# Patient Record
Sex: Male | Born: 2000
Health system: Southern US, Community
[De-identification: ages and names within clinical notes are randomized; demographics above are authoritative.]

## PROBLEM LIST (undated history)

## (undated) DIAGNOSIS — J45909 Unspecified asthma, uncomplicated: Secondary | ICD-10-CM

## (undated) DIAGNOSIS — F32A Depression, unspecified: Secondary | ICD-10-CM

## (undated) HISTORY — PX: NO PAST SURGERIES: SHX2092

---
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
## 2020-02-03 ENCOUNTER — Other Ambulatory Visit: Payer: Self-pay

## 2020-02-03 ENCOUNTER — Ambulatory Visit (INDEPENDENT_AMBULATORY_CARE_PROVIDER_SITE_OTHER): Payer: Medicaid Other

## 2020-02-03 ENCOUNTER — Ambulatory Visit (HOSPITAL_COMMUNITY)
Admission: EM | Admit: 2020-02-03 | Discharge: 2020-02-03 | Disposition: A | Discharge status: Home / Self Care | Payer: Medicaid Other | Attending: Family Medicine | Admitting: Family Medicine

## 2020-02-03 ENCOUNTER — Encounter (HOSPITAL_COMMUNITY): Payer: Self-pay

## 2020-02-03 DIAGNOSIS — M25572 Pain in left ankle and joints of left foot: Secondary | ICD-10-CM

## 2020-02-03 DIAGNOSIS — S93412A Sprain of calcaneofibular ligament of left ankle, initial encounter: Secondary | ICD-10-CM | POA: Diagnosis not present

## 2020-02-03 NOTE — Discharge Instructions (Signed)
Wear splint as discussed. Complete RICE therapy.

## 2020-02-03 NOTE — ED Triage Notes (Signed)
Pt c/o left ankle pain after falling/colliding into a wooden pallet yesterday while at work. Pt describes ankle weakening laterally. C/o pain to medial and lateral aspect; mild edema noted with limited ROM. Last dose tylenol last night.

## 2020-02-03 NOTE — ED Provider Notes (Signed)
[place]    CSN: [id number] Arrival date & time: 02/03/20  1217      History   Chief Complaint Chief Complaint  Patient presents with  . Ankle Pain    HPI Jeremy Copeland is a 19 y.o. male.   Patient here c/w L ankle pain x last night / this morning.  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  Admits pain, tenderness anterior and lateral aspect of ankle, worse with inversion/eversion of ankle.  Denies n/t, weakness, ecchymosis, swelling.  He has not tried medication or ice for this.     Past Medical History:  Diagnosis Date  . Asthma     There are no problems to display for this patient.   No past surgical history on file.     Home Medications    Prior to Admission medications   Not on File    Family History Family History  Problem Relation Age of Onset  . Healthy Mother   . Healthy Father     Social History Social History   Tobacco Use  . Smoking status: Never Smoker  . Smokeless tobacco: Never Used  Substance Use Topics  . Alcohol use: Never  . Drug use: Never     Allergies   Patient has no known allergies.   Review of Systems Review of Systems  Musculoskeletal: Positive for arthralgias and gait problem. Negative for joint swelling and myalgias.  Skin: Negative for color change.  Neurological: Negative for weakness and numbness.  Hematological: Negative for adenopathy. Does not bruise/bleed easily.  Psychiatric/Behavioral: Negative for confusion and sleep disturbance.     Physical Exam Triage Vital Signs ED Triage Vitals  Enc Vitals Group     BP 02/03/20 1323 (!) 157/128     Pulse Rate 02/03/20 1323 75     Resp 02/03/20 1323 18     Temp 02/03/20 1323 98.4 F (36.9 C)     Temp Source 02/03/20 1323 Oral     SpO2 02/03/20 1323 98 %     Weight --      Height --       Head Circumference --      Peak Flow --      Pain Score 02/03/20 1322 8     Pain Loc --      Pain Edu? --      Excl. in [place]? --    No data found.  Updated Vital Signs BP 126/78 (BP Location: Left Arm) Comment: re-eval  Pulse 75   Temp 98.4 F (36.9 C) (Oral)   Resp 18   SpO2 98%   Visual Acuity Right Eye Distance:   Left Eye Distance:   Bilateral Distance:    Right Eye Near:   Left Eye Near:    Bilateral Near:     Physical Exam Vitals and nursing note reviewed.  Constitutional:      Appearance: Normal appearance. He is well-developed.  HENT:     Head: Normocephalic and atraumatic.     Nose: Nose normal.     Mouth/Throat:     Mouth: Mucous membranes are moist.  Eyes:     General: No scleral icterus.    Conjunctiva/sclera: Conjunctivae normal.  Pulmonary:     Effort: Pulmonary effort is normal. No respiratory distress.  Musculoskeletal:  General: Normal range of motion.     Cervical back: Normal range of motion and neck supple. No rigidity.     Right ankle: Normal.     Left ankle: No swelling, deformity, ecchymosis or lacerations. Tenderness present over the lateral malleolus, ATF ligament and CF ligament. No posterior TF ligament tenderness. Normal range of motion. Anterior drawer test negative.     Left Achilles Tendon: No tenderness. Thompson's test negative.  Skin:    General: Skin is warm and dry.     Capillary Refill: Capillary refill takes less than 2 seconds.  Neurological:     General: No focal deficit present.     Mental Status: He is alert and oriented to person, place, and time.     Gait: Gait normal.  Psychiatric:        Mood and Affect: Mood normal.        Behavior: Behavior normal.      UC Treatments / Results  Labs (all labs ordered are listed, but only abnormal results are displayed) Labs Reviewed - No data to display  EKG   Radiology DG Ankle Complete Left  Result Date: 02/03/2020 CLINICAL DATA:  Acute left ankle pain  after twisting injury yesterday. EXAM: LEFT ANKLE COMPLETE - 3+ VIEW COMPARISON:  None. FINDINGS: There is no evidence of fracture, dislocation, or joint effusion. There is no evidence of arthropathy or other focal bone abnormality. Soft tissues are unremarkable. IMPRESSION: Negative. Electronically Signed   By: Obie Dredge M.D.   On: 02/03/2020 14:33    Procedures Procedures (including critical care time)  Medications Ordered in UC Medications - No data to display  Initial Impression / Assessment and Plan / UC Course  I have reviewed the triage vital signs and the nursing notes.  Pertinent labs & imaging results that were available during my care of the patient were reviewed by me and considered in my medical decision making (see chart for details).     Wear splint Start RICE therapy No running x 1 week, try to rest ankle as much as possible. Final Clinical Impressions(s) / UC Diagnoses   Final diagnoses:  Sprain of calcaneofibular ligament of left ankle, initial encounter     Discharge Instructions     Wear splint as discussed. Complete RICE therapy.    ED Prescriptions    None     PDMP not reviewed this encounter.   Evern Core, PA-C 02/03/20 1443

## 2020-04-28 ENCOUNTER — Other Ambulatory Visit: Payer: Self-pay

## 2020-04-28 ENCOUNTER — Ambulatory Visit (HOSPITAL_COMMUNITY)
Admission: EM | Admit: 2020-04-28 | Discharge: 2020-04-28 | Disposition: A | Discharge status: Home / Self Care | Payer: Medicaid Other | Attending: Family Medicine | Admitting: Family Medicine

## 2020-04-28 ENCOUNTER — Ambulatory Visit (INDEPENDENT_AMBULATORY_CARE_PROVIDER_SITE_OTHER): Payer: Medicaid Other

## 2020-04-28 ENCOUNTER — Encounter (HOSPITAL_COMMUNITY): Payer: Self-pay | Admitting: Gynecology

## 2020-04-28 DIAGNOSIS — R079 Chest pain, unspecified: Secondary | ICD-10-CM

## 2020-04-28 DIAGNOSIS — J209 Acute bronchitis, unspecified: Secondary | ICD-10-CM

## 2020-04-28 DIAGNOSIS — R0602 Shortness of breath: Secondary | ICD-10-CM | POA: Diagnosis not present

## 2020-04-28 MED ORDER — PREDNISONE 10 MG (21) PO TBPK: ORAL_TABLET | Freq: Every day | ORAL | 0 refills | Status: AC

## 2020-04-28 MED ORDER — ALBUTEROL SULFATE HFA 108 (90 BASE) MCG/ACT IN AERS: 2.0000 | INHALATION_SPRAY | RESPIRATORY_TRACT | 0 refills | Status: DC | PRN

## 2020-04-28 NOTE — Discharge Instructions (Addendum)
You have bronchitis  Your chest xray was negative  I have sent in a prednisone taper for you to take for 6 days. 6 tablets on day one, 5 tablets on day two, 4 tablets on day three, 3 tablets on day four, 2 tablets on day five, and 1 tablet on day six.  May use albuterol inhaler 2 puffs every 4 hours as needed for shortness of breath, wheezing  Follow up with this office or with primary care as needed  Go to the ER for trouble swallowing, trouble breathing, other concerning symptoms

## 2020-04-28 NOTE — ED Provider Notes (Signed)
[facility]   [id number] 04/28/20 Arrival Time: 1323   CC: COVID symptoms  SUBJECTIVE: History from: patient.  Jeremy Copeland is a 19 y.o. male who presents with shortness of breath and chest tightness and burning. Reports that he received his first dose of Covid vaccine about a week ago. Denies sick exposure to COVID, flu or strep. Denies recent travel. Has positive history of Covid. Has not completed Covid vaccines. Has not taken OTC medications for this. There are no aggravating or alleviating factors. Denies previous symptoms in the past. Denies fever, chills, sinus pain, rhinorrhea, sore throat, wheezing, nausea, changes in bowel or bladder habits. Declines Covid testing today.   ROS: As per HPI.  All other pertinent ROS negative.     Past Medical History:  Diagnosis Date  . Asthma    History reviewed. No pertinent surgical history. No Known Allergies No current facility-administered medications on file prior to encounter.   No current outpatient medications on file prior to encounter.   Social History   Socioeconomic History  . Marital status: Single    Spouse name: Not on file  . Number of children: Not on file  . Years of education: Not on file  . Highest education level: Not on file  Occupational History  . Not on file  Tobacco Use  . Smoking status: Never Smoker  . Smokeless tobacco: Never Used  Substance and Sexual Activity  . Alcohol use: Never  . Drug use: Never  . Sexual activity: Yes  Other Topics Concern  . Not on file  Social History Narrative  . Not on file   Social Determinants of Health   Financial Resource Strain:   . Difficulty of Paying Living Expenses: Not on file  Food Insecurity:   . Worried About Programme researcher, broadcasting/film/video in the Last Year: Not on file  . Ran Out of Food in the Last Year: Not on file  Transportation Needs:   . Lack of Transportation (Medical): Not on file  . Lack of Transportation (Non-Medical): Not on file    Physical Activity:   . Days of Exercise per Week: Not on file  . Minutes of Exercise per Session: Not on file  Stress:   . Feeling of Stress : Not on file  Social Connections:   . Frequency of Communication with Friends and Family: Not on file  . Frequency of Social Gatherings with Friends and Family: Not on file  . Attends Religious Services: Not on file  . Active Member of Clubs or Organizations: Not on file  . Attends Banker Meetings: Not on file  . Marital Status: Not on file  Intimate Partner Violence:   . Fear of Current or Ex-Partner: Not on file  . Emotionally Abused: Not on file  . Physically Abused: Not on file  . Sexually Abused: Not on file   Family History  Problem Relation Age of Onset  . Healthy Mother   . Healthy Father     OBJECTIVE:  Vitals:   04/28/20 1544 04/28/20 1547  BP: 127/67   Pulse: 80   Resp: 16   Temp: 98.1 F (36.7 C)   TempSrc: Oral   SpO2: 98%   Weight:  220 lb (99.8 kg)  Height:  5\' 11"  (1.803 m)     General appearance: alert; appears fatigued, but nontoxic; speaking in full sentences and tolerating own secretions HEENT: NCAT; Ears: EACs clear, TMs pearly gray; Eyes: PERRL.  EOM grossly intact. Sinuses: nontender;  Nose: nares patent without rhinorrhea, Throat: oropharynx clear, tonsils non erythematous or enlarged, uvula midline  Neck: supple without LAD Lungs: unlabored respirations, symmetrical air entry; cough: absent; no respiratory distress; diminished lung sounds Heart: regular rate and rhythm.  Radial pulses 2+ symmetrical bilaterally Skin: warm and dry Psychological: alert and cooperative; normal mood and affect  LABS:  No results found for this or any previous visit (from the past 24 hour(s)).   ASSESSMENT & PLAN:  1. Acute bronchitis, unspecified organism   2. SOB (shortness of breath)   3. Chest pain, unspecified type     Meds ordered this encounter  Medications  . albuterol (VENTOLIN HFA) 108 (90  Base) MCG/ACT inhaler    Sig: Inhale 2 puffs into the lungs every 4 (four) hours as needed for wheezing or shortness of breath.    Dispense:  18 g    Refill:  0    Order Specific Question:   Supervising Provider    Answer:   Merrilee Jansky [id number]  . predniSONE (STERAPRED UNI-PAK 21 TAB) 10 MG (21) TBPK tablet    Sig: Take by mouth daily for 6 days. Take 6 tablets on day 1, 5 tablets on day 2, 4 tablets on day 3, 3 tablets on day 4, 2 tablets on day 5, 1 tablet on day 6    Dispense:  21 tablet    Refill:  0    Order Specific Question:   Supervising Provider    Answer:   Merrilee Jansky [id number]   Prescribed albuterol inhaler Prescribed steroid taper  Chest xray negative Work note provided COVID testing ordered.  It will take between 1-2 days for test results.  Someone will contact you regarding abnormal results.    Patient should remain in quarantine until they have received Covid results.  If negative you may resume normal activities (go back to work/school) while practicing hand hygiene, social distance, and mask wearing.  If positive, patient should remain in quarantine for 10 days from symptom onset AND greater than 72 hours after symptoms resolution (absence of fever without the use of fever-reducing medication and improvement in respiratory symptoms), whichever is longer Get plenty of rest and push fluids Use OTC zyrtec for nasal congestion, runny nose, and/or sore throat Use OTC flonase for nasal congestion and runny nose Use medications daily for symptom relief Use OTC medications like ibuprofen or tylenol as needed fever or pain Call or go to the ED if you have any new or worsening symptoms such as fever, worsening cough, shortness of breath, chest tightness, chest pain, turning blue, changes in mental status.  Reviewed expectations re: course of current medical issues. Questions answered. Outlined signs and symptoms indicating need for more acute intervention. Patient  verbalized understanding. After Visit Summary given.         Moshe Cipro, NP 04/28/20 1732

## 2020-05-12 ENCOUNTER — Encounter (HOSPITAL_COMMUNITY): Payer: Self-pay | Admitting: Emergency Medicine

## 2020-05-12 ENCOUNTER — Ambulatory Visit (HOSPITAL_COMMUNITY)
Admission: EM | Admit: 2020-05-12 | Discharge: 2020-05-12 | Disposition: A | Discharge status: Home / Self Care | Payer: Medicaid Other | Attending: Internal Medicine | Admitting: Internal Medicine

## 2020-05-12 ENCOUNTER — Other Ambulatory Visit: Payer: Self-pay

## 2020-05-12 DIAGNOSIS — J069 Acute upper respiratory infection, unspecified: Secondary | ICD-10-CM | POA: Diagnosis not present

## 2020-05-12 DIAGNOSIS — R05 Cough: Secondary | ICD-10-CM | POA: Insufficient documentation

## 2020-05-12 DIAGNOSIS — Z20822 Contact with and (suspected) exposure to covid-19: Secondary | ICD-10-CM | POA: Insufficient documentation

## 2020-05-12 DIAGNOSIS — J45909 Unspecified asthma, uncomplicated: Secondary | ICD-10-CM | POA: Diagnosis not present

## 2020-05-12 DIAGNOSIS — R1084 Generalized abdominal pain: Secondary | ICD-10-CM | POA: Insufficient documentation

## 2020-05-12 DIAGNOSIS — R112 Nausea with vomiting, unspecified: Secondary | ICD-10-CM | POA: Diagnosis not present

## 2020-05-12 LAB — SARS CORONAVIRUS 2 (TAT 6-24 HRS): SARS Coronavirus 2: NEGATIVE

## 2020-05-12 MED ORDER — BENZONATATE 200 MG PO CAPS: 200.0000 mg | ORAL_CAPSULE | Freq: Three times a day (TID) | ORAL | 0 refills | Status: AC | PRN

## 2020-05-12 MED ORDER — KETOROLAC TROMETHAMINE 30 MG/ML IJ SOLN
30.0000 mg | Freq: Once | INTRAMUSCULAR | Status: AC
  Administered 2020-05-12: 30 mg via INTRAMUSCULAR

## 2020-05-12 MED ORDER — ONDANSETRON 4 MG PO TBDP
ORAL_TABLET | ORAL | Status: AC
  Filled 2020-05-12: qty 1

## 2020-05-12 MED ORDER — IBUPROFEN 800 MG PO TABS: 800.0000 mg | ORAL_TABLET | Freq: Three times a day (TID) | ORAL | 0 refills | Status: DC

## 2020-05-12 MED ORDER — ONDANSETRON 4 MG PO TBDP
4.0000 mg | ORAL_TABLET | Freq: Once | ORAL | Status: AC
  Administered 2020-05-12: 4 mg via ORAL

## 2020-05-12 MED ORDER — KETOROLAC TROMETHAMINE 30 MG/ML IJ SOLN
INTRAMUSCULAR | Status: AC
  Filled 2020-05-12: qty 1

## 2020-05-12 MED ORDER — ONDANSETRON 4 MG PO TBDP: 4.0000 mg | ORAL_TABLET | Freq: Three times a day (TID) | ORAL | 0 refills | Status: DC | PRN

## 2020-05-12 MED ORDER — FLUTICASONE PROPIONATE 50 MCG/ACT NA SUSP: 1.0000 | Freq: Every day | NASAL | 0 refills | Status: DC

## 2020-05-12 MED ORDER — DICYCLOMINE HCL 20 MG PO TABS: 20.0000 mg | ORAL_TABLET | Freq: Three times a day (TID) | ORAL | 0 refills | Status: DC

## 2020-05-12 NOTE — ED Triage Notes (Signed)
Started feeling bad yesterday, but much worse today.  Patient congestion, headache,sharp pain in stomach and vomiting.

## 2020-05-12 NOTE — Discharge Instructions (Signed)
COVID test pending- monitor mychart for results  Zofran as needed for nausea May try dicyclomine/Bentyl as needed for abdominal pain/cramping Ibuprofen and Tylenol for fevers body aches, headache, sore throat Flonase nasal spray for sinus congestion and pressure Tessalon/benzonatate every 8 hours as needed for cough Rest and fluids Follow-up if not improving or worsening

## 2020-05-12 NOTE — ED Notes (Signed)
Checked on patient in lobby due to patient access request

## 2020-05-13 NOTE — ED Provider Notes (Signed)
[facility]    CSN: [id number] Arrival date & time: 05/12/20  1344      History   Chief Complaint Chief Complaint  Patient presents with   URI    HPI Jeremy Copeland is a 19 y.o. male history of asthma presenting today for evaluation of URI symptoms and nausea vomiting diarrhea.  Patient reports over the past 2 days he has had cough and sore throat, but has also had abdominal pain with nausea and vomiting.  Symptoms worsened today.  Reports a lot of discomfort in his abdomen as well as headache and sore throat.  Reports some cough and shortness of breath.  HPI  Past Medical History:  Diagnosis Date   Asthma     There are no problems to display for this patient.   History reviewed. No pertinent surgical history.     Home Medications    Prior to Admission medications   Medication Sig Start Date End Date Taking? Authorizing Provider  albuterol (VENTOLIN HFA) 108 (90 Base) MCG/ACT inhaler Inhale 2 puffs into the lungs every 4 (four) hours as needed for wheezing or shortness of breath. 04/28/20   Moshe Cipro, NP  benzonatate (TESSALON) 200 MG capsule Take 1 capsule (200 mg total) by mouth 3 (three) times daily as needed for up to 7 days for cough. 05/12/20 05/19/20  Curstin Schmale C, PA-C  dicyclomine (BENTYL) 20 MG tablet Take 1 tablet (20 mg total) by mouth 4 (four) times daily -  before meals and at bedtime. 05/12/20   Jamirra Curnow C, PA-C  fluticasone (FLONASE) 50 MCG/ACT nasal spray Place 1-2 sprays into both nostrils daily for 7 days. 05/12/20 05/19/20  Teresia Myint C, PA-C  ibuprofen (ADVIL) 800 MG tablet Take 1 tablet (800 mg total) by mouth 3 (three) times daily. 05/12/20   Author Hatlestad C, PA-C  ondansetron (ZOFRAN ODT) 4 MG disintegrating tablet Take 1-2 tablets (4-8 mg total) by mouth every 8 (eight) hours as needed for nausea or vomiting. 05/12/20   Anais Koenen, Junius Creamer, PA-C    Family History Family History  Problem Relation Age of Onset    Healthy Mother    Healthy Father     Social History Social History   Tobacco Use   Smoking status: Never Smoker   Smokeless tobacco: Never Used  Substance Use Topics   Alcohol use: Never   Drug use: Never     Allergies   Patient has no known allergies.   Review of Systems Review of Systems  Constitutional: Positive for appetite change, chills, fatigue and fever. Negative for activity change.  HENT: Positive for congestion, rhinorrhea and sore throat. Negative for ear pain, sinus pressure and trouble swallowing.   Eyes: Negative for discharge and redness.  Respiratory: Positive for cough. Negative for chest tightness and shortness of breath.   Cardiovascular: Negative for chest pain.  Gastrointestinal: Positive for abdominal pain, nausea and vomiting. Negative for diarrhea.  Musculoskeletal: Negative for myalgias.  Skin: Negative for rash.  Neurological: Positive for headaches. Negative for dizziness and light-headedness.     Physical Exam Triage Vital Signs ED Triage Vitals  Enc Vitals Group     BP 05/12/20 1502 127/78     Pulse Rate 05/12/20 1502 96     Resp 05/12/20 1502 18     Temp 05/12/20 1502 99.7 F (37.6 C)     Temp Source 05/12/20 1502 Oral     SpO2 05/12/20 1502 97 %     Weight --  Height --      Head Circumference --      Peak Flow --      Pain Score 05/12/20 1500 10     Pain Loc --      Pain Edu? --      Excl. in GC? --    No data found.  Updated Vital Signs BP 127/78 (BP Location: Right Arm) Comment (BP Location): large cuff   Pulse 96    Temp 99.7 F (37.6 C) (Oral)    Resp 18    SpO2 97%   Visual Acuity Right Eye Distance:   Left Eye Distance:   Bilateral Distance:    Right Eye Near:   Left Eye Near:    Bilateral Near:     Physical Exam Vitals and nursing note reviewed.  Constitutional:      Appearance: He is well-developed.     Comments: No acute distress Appears uncomfortable, curled in ball  HENT:     Head:  Normocephalic and atraumatic.     Ears:     Comments: Bilateral ears without tenderness to palpation of external auricle, tragus and mastoid, EAC's without erythema or swelling, TM's with good bony landmarks and cone of light. Non erythematous.     Nose: Nose normal.     Mouth/Throat:     Comments: Oral mucosa pink and moist, no tonsillar enlargement or exudate. Posterior pharynx patent and nonerythematous, no uvula deviation or swelling. Normal phonation. Eyes:     Conjunctiva/sclera: Conjunctivae normal.  Cardiovascular:     Rate and Rhythm: Normal rate.  Pulmonary:     Effort: Pulmonary effort is normal. No respiratory distress.     Comments: Breathing comfortably at rest, CTABL, no wheezing, rales or other adventitious sounds auscultated Abdominal:     General: There is no distension.     Comments: Soft, nondistended, generalized tenderness throughout abdomen, more focal to bilateral lower quadrants, negative rebound, negative Rovsing, negative McBurney's  Musculoskeletal:        General: Normal range of motion.     Cervical back: Neck supple.  Skin:    General: Skin is warm and dry.  Neurological:     Mental Status: He is alert and oriented to person, place, and time.      UC Treatments / Results  Labs (all labs ordered are listed, but only abnormal results are displayed) Labs Reviewed  SARS CORONAVIRUS 2 (TAT 6-24 HRS)    EKG   Radiology No results found.  Procedures Procedures (including critical care time)  Medications Ordered in UC Medications  ketorolac (TORADOL) 30 MG/ML injection 30 mg (30 mg Intramuscular Given 05/12/20 1804)  ondansetron (ZOFRAN-ODT) disintegrating tablet 4 mg (4 mg Oral Given 05/12/20 1804)    Initial Impression / Assessment and Plan / UC Course  I have reviewed the triage vital signs and the nursing notes.  Pertinent labs & imaging results that were available during my care of the patient were reviewed by me and considered in my  medical decision making (see chart for details).     Provided Zofran and Toradol in clinic due to patient's discomfort, did have improvement in abdominal pain.  Covid test pending.  Suspect likely viral etiology given associated URI symptoms including cough and GI upset.  Rest and fluids.  Lower suspicion of strep, oropharynx unremarkable on exam today, no signs of tonsillitis or peritonsillar abscess, suspect more related to drainage/irritation from cough. Continue to monitor,Discussed strict return precautions. Patient verbalized understanding and is agreeable  with plan.   Final Clinical Impressions(s) / UC Diagnoses   Final diagnoses:  Viral URI with cough  Non-intractable vomiting with nausea, unspecified vomiting type  Generalized abdominal pain     Discharge Instructions     COVID test pending- monitor mychart for results  Zofran as needed for nausea May try dicyclomine/Bentyl as needed for abdominal pain/cramping Ibuprofen and Tylenol for fevers body aches, headache, sore throat Flonase nasal spray for sinus congestion and pressure Tessalon/benzonatate every 8 hours as needed for cough Rest and fluids Follow-up if not improving or worsening   ED Prescriptions    Medication Sig Dispense Auth. Provider   ondansetron (ZOFRAN ODT) 4 MG disintegrating tablet Take 1-2 tablets (4-8 mg total) by mouth every 8 (eight) hours as needed for nausea or vomiting. 20 tablet Linc Renne C, PA-C   dicyclomine (BENTYL) 20 MG tablet Take 1 tablet (20 mg total) by mouth 4 (four) times daily -  before meals and at bedtime. 20 tablet Gelila Well C, PA-C   ibuprofen (ADVIL) 800 MG tablet Take 1 tablet (800 mg total) by mouth 3 (three) times daily. 21 tablet Osmany Azer C, PA-C   benzonatate (TESSALON) 200 MG capsule Take 1 capsule (200 mg total) by mouth 3 (three) times daily as needed for up to 7 days for cough. 28 capsule Yoshi Vicencio C, PA-C   fluticasone (FLONASE) 50 MCG/ACT  nasal spray Place 1-2 sprays into both nostrils daily for 7 days. 1 g Olie Scaffidi, [place] C, PA-C     PDMP not reviewed this encounter.   Lew Dawes, PA-C 05/13/20 1139

## 2020-06-24 ENCOUNTER — Other Ambulatory Visit: Payer: Self-pay

## 2020-06-24 ENCOUNTER — Encounter (HOSPITAL_COMMUNITY): Payer: Self-pay

## 2020-06-24 ENCOUNTER — Ambulatory Visit (HOSPITAL_COMMUNITY)
Admission: EM | Admit: 2020-06-24 | Discharge: 2020-06-24 | Disposition: A | Discharge status: Home / Self Care | Payer: Medicaid Other | Attending: Family Medicine | Admitting: Family Medicine

## 2020-06-24 DIAGNOSIS — R0602 Shortness of breath: Secondary | ICD-10-CM

## 2020-06-24 DIAGNOSIS — R062 Wheezing: Secondary | ICD-10-CM

## 2020-06-24 DIAGNOSIS — J4541 Moderate persistent asthma with (acute) exacerbation: Secondary | ICD-10-CM

## 2020-06-24 DIAGNOSIS — R059 Cough, unspecified: Secondary | ICD-10-CM

## 2020-06-24 MED ORDER — DEXAMETHASONE SODIUM PHOSPHATE 10 MG/ML IJ SOLN
10.0000 mg | Freq: Once | INTRAMUSCULAR | Status: AC
  Administered 2020-06-24: 10 mg via INTRAMUSCULAR

## 2020-06-24 MED ORDER — ALBUTEROL SULFATE HFA 108 (90 BASE) MCG/ACT IN AERS
2.0000 | INHALATION_SPRAY | Freq: Once | RESPIRATORY_TRACT | Status: AC
  Administered 2020-06-24: 2 via RESPIRATORY_TRACT

## 2020-06-24 MED ORDER — ALBUTEROL SULFATE HFA 108 (90 BASE) MCG/ACT IN AERS
INHALATION_SPRAY | RESPIRATORY_TRACT | Status: AC
  Filled 2020-06-24: qty 6.7

## 2020-06-24 MED ORDER — DEXAMETHASONE SODIUM PHOSPHATE 10 MG/ML IJ SOLN
INTRAMUSCULAR | Status: AC
  Filled 2020-06-24: qty 1

## 2020-06-24 MED ORDER — CETIRIZINE HCL 10 MG PO TABS: 10.0000 mg | ORAL_TABLET | Freq: Every day | ORAL | 0 refills | Status: DC

## 2020-06-24 MED ORDER — FLUTICASONE-SALMETEROL 250-50 MCG/DOSE IN AEPB: 1.0000 | INHALATION_SPRAY | Freq: Two times a day (BID) | RESPIRATORY_TRACT | 1 refills | Status: DC

## 2020-06-24 MED ORDER — MONTELUKAST SODIUM 10 MG PO TABS: 10.0000 mg | ORAL_TABLET | Freq: Every day | ORAL | 1 refills | Status: DC

## 2020-06-24 NOTE — ED Triage Notes (Signed)
Pt present SOB and chest pain, pt states his symptom started a month ago. Pt states this is a continuous situation, Pt states it hurts to take deep breath.

## 2020-06-24 NOTE — Discharge Instructions (Addendum)
Take zyrtec daily  Take singulair daily  Take 2 puffs of advair twice a day. This is your preventative inhaler.  Use albuterol 2 puffs every 4-6 hours for shortness of breath, wheezing. This is your rescue inhaler.  Referral placed for asthma and allergy. Get in with them as soon as possible.

## 2020-06-27 NOTE — ED Provider Notes (Signed)
[facility]   [id number] 06/24/20 Arrival Time: 1447   SUBJECTIVE:  Jeremy Copeland is a 19 y.o. male who presents with complaint of shortness of breath, increasing chest tightness and persistent cough. Has hx uncontrolled asthma. Does not have PCP. Does not see asthma and allergy. Onset abrupt approximately 1 month ago. Overall fatigued. SOB: mild. Wheezing: moderate. Has negative history of Covid. Has not completed Covid vaccines. Has not been using albuterol inhaler or any preventative medications. 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 Reports that he thinks that this is irritating his airway. Denies headache, nausea, vomiting, diarrhea, rash, fever, other symptoms. OTC treatment: none. Declines Covid testing today. Social History   Tobacco Use  Smoking Status Never Smoker  Smokeless Tobacco Never Used    ROS: As per HPI.   OBJECTIVE:  Vitals:   06/24/20 1528  BP: (!) 129/96  Pulse: 65  Resp: 18  Temp: 98.9 F (37.2 C)  TempSrc: Oral  SpO2: 100%     General appearance: alert; no distress HEENT: nasal congestion; clear runny nose; throat irritation secondary to post-nasal drainage Neck: supple without LAD Lungs: moderate dry cough present, diffuse wheezing throughout bilateral lung fields Skin: warm and dry Psychological: alert and cooperative; normal mood and affect  Results for orders placed or performed during the hospital encounter of 05/12/20  SARS CORONAVIRUS 2 (TAT 6-24 HRS) Nasopharyngeal Nasopharyngeal Swab   Specimen: Nasopharyngeal Swab  Result Value Ref Range   SARS Coronavirus 2 NEGATIVE NEGATIVE    Labs Reviewed - No data to display  No results found.  No Known Allergies  Past Medical History:  Diagnosis Date  . Asthma    Social History   Socioeconomic History  . Marital status: Single    Spouse name: Not on file  . Number of children: Not on file  . Years of education: Not on file  .  Highest education level: Not on file  Occupational History  . Not on file  Tobacco Use  . Smoking status: Never Smoker  . Smokeless tobacco: Never Used  Substance and Sexual Activity  . Alcohol use: Never  . Drug use: Never  . Sexual activity: Yes  Other Topics Concern  . Not on file  Social History Narrative  . Not on file   Social Determinants of Health   Financial Resource Strain:   . Difficulty of Paying Living Expenses: Not on file  Food Insecurity:   . Worried About Programme researcher, broadcasting/film/video in the Last Year: Not on file  . Ran Out of Food in the Last Year: Not on file  Transportation Needs:   . Lack of Transportation (Medical): Not on file  . Lack of Transportation (Non-Medical): Not on file  Physical Activity:   . Days of Exercise per Week: Not on file  . Minutes of Exercise per Session: Not on file  Stress:   . Feeling of Stress : Not on file  Social Connections:   . Frequency of Communication with Friends and Family: Not on file  . Frequency of Social Gatherings with Friends and Family: Not on file  . Attends Religious Services: Not on file  . Active Member of Clubs or Organizations: Not on file  . Attends Banker Meetings: Not on file  . Marital Status: Not on file  Intimate Partner Violence:   . Fear of Current or Ex-Partner: Not on file  . Emotionally Abused: Not on file  .  Physically Abused: Not on file  . Sexually Abused: Not on file   Family History  Problem Relation Age of Onset  . Healthy Mother   . Healthy Father      ASSESSMENT & PLAN:  1. Moderate persistent asthma with acute exacerbation     Meds ordered this encounter  Medications  . dexamethasone (DECADRON) injection 10 mg  . albuterol (VENTOLIN HFA) 108 (90 Base) MCG/ACT inhaler 2 puff  . cetirizine (ZYRTEC ALLERGY) 10 MG tablet    Sig: Take 1 tablet (10 mg total) by mouth daily.    Dispense:  30 tablet    Refill:  0    Order Specific Question:   Supervising Provider     Answer:   Merrilee Jansky [id number]  . montelukast (SINGULAIR) 10 MG tablet    Sig: Take 1 tablet (10 mg total) by mouth at bedtime.    Dispense:  30 tablet    Refill:  1    Order Specific Question:   Supervising Provider    Answer:   Merrilee Jansky [id number]  . Fluticasone-Salmeterol (ADVAIR DISKUS) 250-50 MCG/DOSE AEPB    Sig: Inhale 1 puff into the lungs 2 (two) times daily.    Dispense:  60 each    Refill:  1    Order Specific Question:   Supervising Provider    Answer:   Merrilee Jansky [id number]   Counseled on the need for PCP establishment and preventative treatment for poorly controlled asthma. Has had 3 visits for similar symptoms in the last 2 months Decadron 10mg  IM in office today Albuterol inhaler given in office today Prescribed zyrtec Prescribed singulair Prescribed flonase Counseled that he MUST take medications to help resolve and prevent future symptoms Referral placed for asthma and allergy  OTC symptom care as needed. Will plan f/u with PCP or here as needed.  Reviewed expectations re: course of current medical issues. Questions answered. Outlined signs and symptoms indicating need for more acute intervention. Patient verbalized understanding. After Visit Summary given.           , NP 06/27/20 [phone]

## 2020-07-16 NOTE — Progress Notes (Signed)
New Patient Note  RE: Jeremy Copeland MRN: [id number] DOB: [birth year] Date of Office Visit: 07/17/2020  Referring provider: Moshe Cipro, NP Primary care provider: Patient, No Pcp Per  Chief Complaint: Asthma  History of Present Illness: I had the pleasure of seeing Jeremy Copeland for initial evaluation at the [facility] on 07/17/2020. He is a 19 y.o. male, who is referred here by urgent care for the evaluation of asthma.  Asthma:  He reports symptoms of chest tightness, shortness of breath, wheezing, nocturnal awakenings for 15+ years but worse the past 1 year. Current medications include albuterol prn which help. He was prescribed Advair and Singulair by the urgent care on 06/24/2020 but he did not pick up these medications.   He reports not using aerochamber with inhalers. He tried the following inhalers: not sure what he used as a child. Main triggers are cold weather, exertion, weather changes. In the last month, frequency of symptoms: daily. Frequency of nocturnal symptoms: every 2 days. Frequency of SABA use: multiples times during the day. Interference with physical activity: yes.  Patient works in a [employer] which requires him to be quite active and it has been difficult to perform his job due to his breathing issues. 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   Sleep is disturbed. In the last 12 months, emergency room visits/urgent care visits/doctor office visits or hospitalizations due to respiratory issues: 4. In the last 12 months, oral steroids courses: 3-4. Lifetime history of hospitalization for respiratory issues: yes as a child. Prior intubations: not sure. History of pneumonia: no. He was not evaluated by allergist/pulmonologist in the past. Smoking exposure: no. Up to date with flu vaccine: no. Up to date with COVID-19 vaccine: yes. Patient  had COVID-19 in February 2021.   History of reflux: possibly.  06/24/2020 UC visit: "Jeremy Copeland is a 19 y.o. male who presents with complaint of shortness of breath, increasing chest tightness and persistent cough. Has hx uncontrolled asthma. Does not have PCP. Does not see asthma and allergy. Onset abrupt approximately 1 month ago. Overall fatigued. SOB: mild. Wheezing: moderate. Has negative history of Covid. Has not completed Covid vaccines. Has not been using albuterol inhaler or any preventative medications. 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 Reports that he thinks that this is irritating his airway. Denies headache, nausea, vomiting, diarrhea, rash, fever, other symptoms. OTC treatment: none. Declines Covid testing today."  Assessment and Plan: Jeremy Copeland is a 19 y.o. male with: Asthma, not well controlled Patient had asthma since a young child.  He is not sure of what exact inhalers he used to take.  Breathing has been under control until the last year.  Patient had COVID-19 in February 2021.  Has been to urgent care at least three times the last few months due to respiratory issues.  Treated with multiple courses of oral steroids with good benefit.  Patient did not pick up Advair and Singulair which was prescribed last month.  Still having to use albuterol throughout the day especially at work as exertion and cold weather tends to flare his symptoms.  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  Today's spirometry showed moderate obstruction with 27% improvement in FEV1 post bronchodilator treatment.  Clinically feeling unchanged. . Stressed importance of taking medications daily.  . Daily controller  medication(s): START Breo 1 puff daily and rinse mouth after each. Sample given and demonstrated proper use. Prescription sent in.  Start Singulair (montelukast) 10mg  daily at night. Cautioned that in some  children/adults can experience behavioral changes including hyperactivity, agitation, depression, sleep disturbances and suicidal ideations. These side effects are rare, but if you notice them you should notify me and discontinue Singulair (montelukast). . May use albuterol rescue inhaler 2 puffs every 4 to 6 hours as needed for shortness of breath, chest tightness, coughing, and wheezing. May use albuterol rescue inhaler 2 puffs 5 to 15 minutes prior to strenuous physical activities. Monitor frequency of use.  . Repeat spirometry at next visit. . Will fax letter to employer stating light duty/sanitation for 1 month - hopefully with the above regimen patient's breathing will improve and he will be able to return to his prior job function.   Chronic rhinitis Some nasal congestion during the winter months.  Deferred allergy skin testing today due to uncontrolled asthma.  Consider environmental allergy testing in the future once asthma is more stable.  Return in about 4 weeks (around 08/14/2020).  Recommend establishing care with PCP.  Meds ordered this encounter  Medications  . montelukast (SINGULAIR) 10 MG tablet    Sig: Take 1 tablet (10 mg total) by mouth at bedtime.    Dispense:  30 tablet    Refill:  5  . fluticasone furoate-vilanterol (BREO ELLIPTA) 200-25 MCG/INH AEPB    Sig: Inhale 1 puff into the lungs daily. And rinse mouth after each use.    Dispense:  60 each    Refill:  2   Other allergy screening: Rhino conjunctivitis:  Nasal congestion during the colder months.  Food allergy: no Medication allergy: no Hymenoptera allergy: no Urticaria: no Eczema:no History of recurrent infections suggestive of immunodeficency: no  Diagnostics: Spirometry:  Tracings reviewed. His effort: Good reproducible efforts. FVC: 4.38L FEV1: 2.64L, 65% predicted FEV1/FVC ratio: 60% Interpretation: Spirometry consistent with moderate obstructive disease with 27% improvement in FEV1 post  bronchodilator treatment.  Clinically feeling unchanged. Please see scanned spirometry results for details.  Skin Testing: deferred due to poor asthma control.  Past Medical History: Patient Active Problem List   Diagnosis Date Noted  . Asthma, not well controlled 07/17/2020  . Chronic rhinitis 07/17/2020   Past Medical History:  Diagnosis Date  . Asthma    Past Surgical History: History reviewed. No pertinent surgical history. Medication List:  Current Outpatient Medications  Medication Sig Dispense Refill  . albuterol (VENTOLIN HFA) 108 (90 Base) MCG/ACT inhaler Inhale 2 puffs into the lungs every 4 (four) hours as needed for wheezing or shortness of breath. 18 g 0  . cetirizine (ZYRTEC ALLERGY) 10 MG tablet Take 1 tablet (10 mg total) by mouth daily. 30 tablet 0  . dicyclomine (BENTYL) 20 MG tablet Take 1 tablet (20 mg total) by mouth 4 (four) times daily -  before meals and at bedtime. 20 tablet 0  . fluticasone (FLONASE) 50 MCG/ACT nasal spray Place 1-2 sprays into both nostrils daily for 7 days. 1 g 0  . fluticasone furoate-vilanterol (BREO ELLIPTA) 200-25 MCG/INH AEPB Inhale 1 puff into the lungs daily. And rinse mouth after each use. 60 each 2  . montelukast (SINGULAIR) 10 MG tablet Take 1 tablet (10 mg total) by mouth at bedtime. 30 tablet 5   No current facility-administered medications for this visit.   Allergies: No Known Allergies Social History: Social History   Socioeconomic History  .  Marital status: Single    Spouse name: Not on file  . Number of children: Not on file  . Years of education: Not on file  . Highest education level: Not on file  Occupational History  . Not on file  Tobacco Use  . Smoking status: Never Smoker  . Smokeless tobacco: Never Used  Vaping Use  . Vaping Use: Never used  Substance and Sexual Activity  . Alcohol use: Never  . Drug use: Never  . Sexual activity: Yes  Other Topics Concern  . Not on file  Social History Narrative   . Not on file   Social Determinants of Health   Financial Resource Strain:   . Difficulty of Paying Living Expenses: Not on file  Food Insecurity:   . Worried About Programme researcher, broadcasting/film/videounning Out of Food in the Last Year: Not on file  . Ran Out of Food in the Last Year: Not on file  Transportation Needs:   . Lack of Transportation (Medical): Not on file  . Lack of Transportation (Non-Medical): Not on file  Physical Activity:   . Days of Exercise per Week: Not on file  . Minutes of Exercise per Session: Not on file  Stress:   . Feeling of Stress : Not on file  Social Connections:   . Frequency of Communication with Friends and Family: Not on file  . Frequency of Social Gatherings with Friends and Family: Not on file  . Attends Religious Services: Not on file  . Active Member of Clubs or Organizations: Not on file  . Attends BankerClub or Organization Meetings: Not on file  . Marital Status: Not on file   Lives in an apartment. Smoking: denies Occupation: [employer]ist[employer]  Environmental HistorySurveyor, minerals: Water Damage/mildew in the house: no Engineer, civil (consulting)Carpet in the family room: no Carpet in the bedroom: no Heating: electric Cooling: fan Pet: no  Family History: Family History  Problem Relation Age of Onset  . Healthy Mother   . Healthy Father    Problem                               Relation Asthma                                   Father, maternal side.    Review of Systems  Constitutional: Negative for appetite change, chills, fever and unexpected weight change.  HENT: Negative for congestion and rhinorrhea.   Eyes: Negative for itching.  Respiratory: Positive for chest tightness, shortness of breath and wheezing. Negative for cough.   Cardiovascular: Negative for chest pain.  Gastrointestinal: Negative for abdominal pain.  Genitourinary: Negative for difficulty urinating.  Skin: Negative for rash.  Neurological: Negative for headaches.   Objective: BP 112/60   Pulse 86   Temp 97.6 F (36.4 C) (Temporal)    Resp 18   Ht 5\' 11"  (1.803 m)   Wt 225 lb 12.8 oz (102.4 kg)   SpO2 98%   BMI 31.49 kg/m  Body mass index is 31.49 kg/m. Physical Exam Vitals and nursing note reviewed.  Constitutional:      Appearance: Normal appearance. He is well-developed.  HENT:     Head: Normocephalic and atraumatic.     Right Ear: External ear normal.     Left Ear: External ear normal.     Nose: Nose normal.     Mouth/Throat:  Mouth: Mucous membranes are moist.     Pharynx: Oropharynx is clear.  Eyes:     Conjunctiva/sclera: Conjunctivae normal.  Cardiovascular:     Rate and Rhythm: Normal rate and regular rhythm.     Heart sounds: Normal heart sounds. No murmur heard.  No friction rub. No gallop.   Pulmonary:     Effort: Pulmonary effort is normal.     Breath sounds: Normal breath sounds. No wheezing, rhonchi or rales.  Abdominal:     Palpations: Abdomen is soft.  Musculoskeletal:     Cervical back: Neck supple.  Skin:    General: Skin is warm.     Findings: No rash.  Neurological:     Mental Status: He is alert and oriented to person, place, and time.  Psychiatric:        Behavior: Behavior normal.    The plan was reviewed with the patient/family, and all questions/concerned were addressed.  It was my pleasure to see Lukah today and participate in his care. Please feel free to contact me with any questions or concerns.  Sincerely,  Wyline Mood, DO Allergy & Immunology  [facility] office: [phone] [facility] office: [phone]

## 2020-07-17 ENCOUNTER — Ambulatory Visit (INDEPENDENT_AMBULATORY_CARE_PROVIDER_SITE_OTHER): Payer: Medicaid Other | Admitting: Allergy

## 2020-07-17 ENCOUNTER — Encounter: Payer: Self-pay | Admitting: Allergy

## 2020-07-17 ENCOUNTER — Telehealth: Payer: Self-pay

## 2020-07-17 ENCOUNTER — Other Ambulatory Visit: Payer: Self-pay

## 2020-07-17 VITALS — BP 112/60 | HR 86 | Temp 97.6°F | Resp 18 | Ht 71.0 in | Wt 225.8 lb

## 2020-07-17 DIAGNOSIS — J45909 Unspecified asthma, uncomplicated: Secondary | ICD-10-CM

## 2020-07-17 DIAGNOSIS — J310 Chronic rhinitis: Secondary | ICD-10-CM | POA: Insufficient documentation

## 2020-07-17 MED ORDER — MONTELUKAST SODIUM 10 MG PO TABS: 10.0000 mg | ORAL_TABLET | Freq: Every day | ORAL | 5 refills | Status: DC

## 2020-07-17 MED ORDER — FLUTICASONE FUROATE-VILANTEROL 200-25 MCG/INH IN AEPB: 1.0000 | INHALATION_SPRAY | Freq: Every day | RESPIRATORY_TRACT | 2 refills | Status: DC

## 2020-07-17 NOTE — Patient Instructions (Addendum)
Asthma: . Daily controller medication(s): START Breo 1 puff daily and rinse mouth after each. Sample given and demonstrated proper use. Prescription sent in.  Start Singulair (montelukast) 10mg  daily at night. Cautioned that in some children/adults can experience behavioral changes including hyperactivity, agitation, depression, sleep disturbances and suicidal ideations. These side effects are rare, but if you notice them you should notify me and discontinue Singulair (montelukast). . May use albuterol rescue inhaler 2 puffs every 4 to 6 hours as needed for shortness of breath, chest tightness, coughing, and wheezing. May use albuterol rescue inhaler 2 puffs 5 to 15 minutes prior to strenuous physical activities. Monitor frequency of use.  . Asthma control goals:  o Full participation in all desired activities (may need albuterol before activity) o Albuterol use two times or less a week on average (not counting use with activity) o Cough interfering with sleep two times or less a month o Oral steroids no more than once a year o No hospitalizations  Follow up in 1 months or sooner if needed.  Establish care with a PCP as soon as possible.  I will either fax the letter to your employer or you can pick it up tomorrow in our [place] office.

## 2020-07-17 NOTE — Telephone Encounter (Signed)
Patient called back and stated he was going to pick up the letter at our office.

## 2020-07-17 NOTE — Assessment & Plan Note (Signed)
Patient had asthma since a young child.  He is not sure of what exact inhalers he used to take.  Breathing has been under control until the last year.  Patient had COVID-19 in February 2021.  Has been to urgent care at least three times the last few months due to respiratory issues.  Treated with multiple courses of oral steroids with good benefit.  Patient did not pick up Advair and Singulair which was prescribed last month.  Still having to use albuterol throughout the day especially at work as exertion and cold weather tends to flare his symptoms.  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  Today's spirometry showed moderate obstruction with 27% improvement in FEV1 post bronchodilator treatment.  Clinically feeling unchanged. . Stressed importance of taking medications daily.  . Daily controller medication(s): START Breo 1 puff daily and rinse mouth after each. Sample given and demonstrated proper use. Prescription sent in.  Start Singulair (montelukast) 10mg  daily at night. Cautioned that in some children/adults can experience behavioral changes including hyperactivity, agitation, depression, sleep disturbances and suicidal ideations. These side effects are rare, but if you notice them you should notify me and discontinue Singulair (montelukast). . May use albuterol rescue inhaler 2 puffs every 4 to 6 hours as needed for shortness of breath, chest tightness, coughing, and wheezing. May use albuterol rescue inhaler 2 puffs 5 to 15 minutes prior to strenuous physical activities. Monitor frequency of use.  . Repeat spirometry at next visit. . Will fax letter to employer stating light duty/sanitation for 1 month - hopefully with the above regimen patient's breathing will improve and he will be able to return to his prior job function.

## 2020-07-17 NOTE — Telephone Encounter (Signed)
Patient stated he was going to go to his employer to get the fax number, stated he is going to call us back to give [place] the fax number or come collect the letter.

## 2020-07-17 NOTE — Assessment & Plan Note (Signed)
Some nasal congestion during the winter months.  Deferred allergy skin testing today due to uncontrolled asthma.  Consider environmental allergy testing in the future once asthma is more stable.

## 2020-07-17 NOTE — Telephone Encounter (Signed)
-----   Message from Ellamae Sia, DO sent at 07/17/2020  2:18 PM EST ----- Please fax letter to employer. Thank you.

## 2020-08-14 ENCOUNTER — Ambulatory Visit (INDEPENDENT_AMBULATORY_CARE_PROVIDER_SITE_OTHER): Payer: Medicaid Other | Admitting: Allergy

## 2020-08-14 ENCOUNTER — Other Ambulatory Visit: Payer: Self-pay

## 2020-08-14 ENCOUNTER — Encounter: Payer: Self-pay | Admitting: Allergy

## 2020-08-14 VITALS — BP 122/78 | HR 94 | Temp 97.9°F | Resp 18 | Ht 70.08 in | Wt 229.8 lb

## 2020-08-14 DIAGNOSIS — J45909 Unspecified asthma, uncomplicated: Secondary | ICD-10-CM | POA: Diagnosis not present

## 2020-08-14 DIAGNOSIS — J310 Chronic rhinitis: Secondary | ICD-10-CM

## 2020-08-14 MED ORDER — BUDESONIDE-FORMOTEROL FUMARATE 160-4.5 MCG/ACT IN AERO: 2.0000 | INHALATION_SPRAY | Freq: Two times a day (BID) | RESPIRATORY_TRACT | 5 refills | Status: DC

## 2020-08-14 NOTE — Patient Instructions (Addendum)
Asthma:  You may resume your regular job but if you notice issues with your breathing - let us know.   Daily controller medication(s): START Symbicort 2 puffs twice a day with spacer and rinse mouth afterwards. o If this is not covered let us know - do not wait until the next appointment. o Spacer given and demonstrated proper use with inhaler. Patient understood technique and all questions/concerned were addressed.  Continue Singulair (montelukast) 10mg  daily at night.  May use albuterol rescue inhaler 2 puffs every 4 to 6 hours as needed for shortness of breath, chest tightness, coughing, and wheezing. May use albuterol rescue inhaler 2 puffs 5 to 15 minutes prior to strenuous physical activities. Monitor frequency of use.   Asthma control goals:  o Full participation in all desired activities (may need albuterol before activity) o Albuterol use two times or less a week on average (not counting use with activity) o Cough interfering with sleep two times or less a month o Oral steroids no more than once a year o No hospitalizations  Follow up in 1 months or sooner if needed.  Establish care with a PCP as soon as possible.

## 2020-08-14 NOTE — Progress Notes (Signed)
Follow Up Note  RE: Jeremy Copeland MRN: [id number] DOB: 21-Oct-2000 Date of Office Visit: 08/14/2020  Referring provider: No ref. provider found Primary care provider: Patient, No Pcp Per  Chief Complaint: Asthma  History of Present Illness: I had the pleasure of seeing Jeremy Copeland for a follow up visit at the [facility] on 08/14/2020. He is a 19 y.o. male, who is being followed for asthma and chronic rhinitis. His previous allergy office visit was on 07/17/2020 with Dr. Selena Batten. Today is a regular follow up visit.  Asthma Breathing is about 70% improved. Still having shortness of breath episodes especially during talking. He has not been very active at work but has played basketball without any issues. Wants to go back to his regular job as it pays more.  Currently on Singulair daily at night.  Patient was unable to pick up Breo due some insurance issues. He only took the sample for 2 weeks. Using albuterol 1-3 times a day with good benefit.  Chronic rhinitis Asymptomatic.   Assessment and Plan: Jeremy Copeland is a 19 y.o. male with: Asthma, not well controlled Past history - Asthma since a young child.  He is not sure of what exact inhalers he used to take.  Breathing has been under control until the last year.  Patient had COVID-19 in February 2021.  Has been to urgent care at least three times the last few months due to respiratory issues.  Treated with multiple courses of oral steroids with good benefit.  Patient did not pick up Advair and Singulair which was prescribed last month.  Still having to use albuterol throughout the day especially at work as exertion and cold weather tends to flare his symptoms.  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 2021 spirometry showed moderate obstruction with 27% improvement in FEV1 post bronchodilator treatment.  Clinically feeling unchanged. Interim history - patient took Breo sample but didn't get  Rx filled due to insurance issues. Taking Singulair daily and using albuterol daily still. 70% improved and would like to go back to his regular job due to pay.  . Today's spirometry improved from previous one. ACT score 13.  . Wrote letter that he may resume his regular job as tolerated. Advised him that he needs to take his daily maintenance inhaler every day. . Daily controller medication(s): START Symbicort 2 puffs twice a day with spacer and rinse mouth afterwards. o If this is not covered let us know - do not wait until the next appointment. o Spacer given and demonstrated proper use with inhaler. Patient understood technique and all questions/concerned were addressed.  Continue Singulair (montelukast) 10mg  daily at night. . May use albuterol rescue inhaler 2 puffs every 4 to 6 hours as needed for shortness of breath, chest tightness, coughing, and wheezing. May use albuterol rescue inhaler 2 puffs 5 to 15 minutes prior to strenuous physical activities. Monitor frequency of use.  . Repeat spirometry at next visit.  Return in about 4 weeks (around 09/11/2020).  Establish care with a PCP as soon as possible.  Meds ordered this encounter  Medications  . budesonide-formoterol (SYMBICORT) 160-4.5 MCG/ACT inhaler    Sig: Inhale 2 puffs into the lungs in the morning and at bedtime. with spacer and rinse mouth afterwards.    Dispense:  1 each    Refill:  5   Diagnostics: Spirometry:  Tracings reviewed. His effort: Good reproducible efforts. FVC: 3.61L FEV1: 2.61L, 69% predicted FEV1/FVC  ratio: 72% Interpretation: Spirometry consistent with normal pattern - improved from previous one.  Please see scanned spirometry results for details.  Medication List:  Current Outpatient Medications  Medication Sig Dispense Refill  . albuterol (VENTOLIN HFA) 108 (90 Base) MCG/ACT inhaler Inhale 2 puffs into the lungs every 4 (four) hours as needed for wheezing or shortness of breath. 18 g 0  .  cetirizine (ZYRTEC ALLERGY) 10 MG tablet Take 1 tablet (10 mg total) by mouth daily. 30 tablet 0  . dicyclomine (BENTYL) 20 MG tablet Take 1 tablet (20 mg total) by mouth 4 (four) times daily -  before meals and at bedtime. 20 tablet 0  . montelukast (SINGULAIR) 10 MG tablet Take 1 tablet (10 mg total) by mouth at bedtime. 30 tablet 5  . budesonide-formoterol (SYMBICORT) 160-4.5 MCG/ACT inhaler Inhale 2 puffs into the lungs in the morning and at bedtime. with spacer and rinse mouth afterwards. 1 each 5  . fluticasone (FLONASE) 50 MCG/ACT nasal spray Place 1-2 sprays into both nostrils daily for 7 days. 1 g 0   No current facility-administered medications for this visit.   Allergies: No Known Allergies I reviewed his past medical history, social history, family history, and environmental history and no significant changes have been reported from his previous visit.  Review of Systems  Constitutional: Negative for appetite change, chills, fever and unexpected weight change.  HENT: Negative for congestion and rhinorrhea.   Eyes: Negative for itching.  Respiratory: Positive for chest tightness and shortness of breath. Negative for cough and wheezing.   Cardiovascular: Negative for chest pain.  Gastrointestinal: Negative for abdominal pain.  Genitourinary: Negative for difficulty urinating.  Skin: Negative for rash.  Neurological: Negative for headaches.   Objective: BP 122/78 (BP Location: Right Arm, Patient Position: Sitting, Cuff Size: Normal)   Pulse 94   Temp 97.9 F (36.6 C) (Temporal)   Resp 18   Ht 5' 10.08" (1.78 m)   Wt 229 lb 12.8 oz (104.2 kg)   SpO2 96%   BMI 32.90 kg/m  Body mass index is 32.9 kg/m. Physical Exam Vitals and nursing note reviewed.  Constitutional:      Appearance: Normal appearance. He is well-developed.  HENT:     Head: Normocephalic and atraumatic.     Right Ear: Tympanic membrane and external ear normal.     Left Ear: Tympanic membrane and  external ear normal.     Nose: Nose normal.     Mouth/Throat:     Mouth: Mucous membranes are moist.     Pharynx: Oropharynx is clear.  Eyes:     Conjunctiva/sclera: Conjunctivae normal.  Cardiovascular:     Rate and Rhythm: Normal rate and regular rhythm.     Heart sounds: Normal heart sounds. No murmur heard. No friction rub. No gallop.   Pulmonary:     Effort: Pulmonary effort is normal.     Breath sounds: Normal breath sounds. No wheezing, rhonchi or rales.  Musculoskeletal:     Cervical back: Neck supple.  Skin:    General: Skin is warm.     Findings: No rash.  Neurological:     Mental Status: He is alert and oriented to person, place, and time.  Psychiatric:        Behavior: Behavior normal.    Previous notes and tests were reviewed. The plan was reviewed with the patient/family, and all questions/concerned were addressed.  It was my pleasure to see Amol today and participate in his care. Please  feel free to contact me with any questions or concerns.  Sincerely,  Rexene Alberts, DO Allergy & Immunology  [facility] office: [place] office: [phone]

## 2020-08-14 NOTE — Assessment & Plan Note (Addendum)
Past history - Asthma since a young child.  He is not sure of what exact inhalers he used to take.  Breathing has been under control until the last year.  Patient had COVID-19 in February 2021.  Has been to urgent care at least three times the last few months due to respiratory issues.  Treated with multiple courses of oral steroids with good benefit.  Patient did not pick up Advair and Singulair which was prescribed last month.  Still having to use albuterol throughout the day especially at work as exertion and cold weather tends to flare his symptoms.  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 2021 spirometry showed moderate obstruction with 27% improvement in FEV1 post bronchodilator treatment.  Clinically feeling unchanged. Interim history - patient took Breo sample but didn't get Rx filled due to insurance issues. Taking Singulair daily and using albuterol daily still. 70% improved and would like to go back to his regular job due to pay.  . Today's spirometry improved from previous one. ACT score 13.  . Wrote letter that he may resume his regular job as tolerated. Advised him that he needs to take his daily maintenance inhaler every day. . Daily controller medication(s): START Symbicort 2 puffs twice a day with spacer and rinse mouth afterwards. o If this is not covered let us know - do not wait until the next appointment. o Spacer given and demonstrated proper use with inhaler. Patient understood technique and all questions/concerned were addressed.  Continue Singulair (montelukast) 10mg  daily at night. . May use albuterol rescue inhaler 2 puffs every 4 to 6 hours as needed for shortness of breath, chest tightness, coughing, and wheezing. May use albuterol rescue inhaler 2 puffs 5 to 15 minutes prior to strenuous physical activities. Monitor frequency of use.  . Repeat spirometry at next visit.

## 2020-10-15 ENCOUNTER — Encounter (HOSPITAL_COMMUNITY): Payer: Self-pay | Admitting: Emergency Medicine

## 2020-10-15 ENCOUNTER — Other Ambulatory Visit: Payer: Self-pay

## 2020-10-15 ENCOUNTER — Ambulatory Visit (HOSPITAL_COMMUNITY)
Admission: EM | Admit: 2020-10-15 | Discharge: 2020-10-15 | Disposition: A | Discharge status: Home / Self Care | Payer: Medicaid Other | Attending: Emergency Medicine | Admitting: Emergency Medicine

## 2020-10-15 DIAGNOSIS — R42 Dizziness and giddiness: Secondary | ICD-10-CM

## 2020-10-15 DIAGNOSIS — R0789 Other chest pain: Secondary | ICD-10-CM

## 2020-10-15 MED ORDER — ALBUTEROL SULFATE HFA 108 (90 BASE) MCG/ACT IN AERS: 2.0000 | INHALATION_SPRAY | RESPIRATORY_TRACT | 0 refills | Status: DC | PRN

## 2020-10-15 NOTE — ED Triage Notes (Signed)
Pt presents today with c/o of chest tightness he believes is from asthma, no change with use of inhaler, also reports dizziness at work last night.

## 2020-10-15 NOTE — ED Provider Notes (Signed)
[facility]    CSN: [id number] Arrival date & time: 10/15/20  1647      History   Chief Complaint Chief Complaint  Patient presents with  . Dizziness  . Shortness of Breath    HPI Jeremy Copeland is a 20 y.o. male.   Patient presents with dizziness since last night.  He also reports chest tightness x4 months.  He denies numbness, weakness, chest pain, shortness of breath, wheezing, fever, chills, or other symptoms.  No treatments attempted at home.  Patient has history of asthma and believes this is the cause of his symptoms.  He has not used his inhaler.  He is followed by an allergist for his asthma; last seen on 08/14/2020.  The history is provided by the patient and medical records.    Past Medical History:  Diagnosis Date  . Asthma     Patient Active Problem List   Diagnosis Date Noted  . Asthma, not well controlled 07/17/2020  . Chronic rhinitis 07/17/2020    History reviewed. No pertinent surgical history.     Home Medications    Prior to Admission medications   Medication Sig Start Date End Date Taking? Authorizing Provider  albuterol (VENTOLIN HFA) 108 (90 Base) MCG/ACT inhaler Inhale 2 puffs into the lungs every 4 (four) hours as needed for wheezing or shortness of breath. 10/15/20   Mickie Bail, NP  budesonide-formoterol (SYMBICORT) 160-4.5 MCG/ACT inhaler Inhale 2 puffs into the lungs in the morning and at bedtime. with spacer and rinse mouth afterwards. 08/14/20   Ellamae Sia, DO  cetirizine (ZYRTEC ALLERGY) 10 MG tablet Take 1 tablet (10 mg total) by mouth daily. 06/24/20   Moshe Cipro, NP  dicyclomine (BENTYL) 20 MG tablet Take 1 tablet (20 mg total) by mouth 4 (four) times daily -  before meals and at bedtime. 05/12/20   Wieters, Hallie C, PA-C  fluticasone (FLONASE) 50 MCG/ACT nasal spray Place 1-2 sprays into both nostrils daily for 7 days. 05/12/20 05/19/20  Wieters, Hallie C, PA-C  montelukast (SINGULAIR) 10 MG tablet Take 1 tablet  (10 mg total) by mouth at bedtime. 07/17/20   Ellamae Sia, DO    Family History Family History  Problem Relation Age of Onset  . Healthy Mother   . Healthy Father     Social History Social History   Tobacco Use  . Smoking status: Never Smoker  . Smokeless tobacco: Never Used  Vaping Use  . Vaping Use: Never used  Substance Use Topics  . Alcohol use: Never  . Drug use: Never     Allergies   Patient has no known allergies.   Review of Systems Review of Systems  Constitutional: Negative for chills and fever.  HENT: Negative for ear pain and sore throat.   Eyes: Negative for pain and visual disturbance.  Respiratory: Positive for chest tightness. Negative for cough and shortness of breath.   Cardiovascular: Negative for chest pain and palpitations.  Gastrointestinal: Negative for abdominal pain and vomiting.  Genitourinary: Negative for dysuria and hematuria.  Musculoskeletal: Negative for arthralgias and back pain.  Skin: Negative for color change and rash.  Neurological: Positive for dizziness. Negative for syncope, weakness, numbness and headaches.  All other systems reviewed and are negative.    Physical Exam Triage Vital Signs ED Triage Vitals  Enc Vitals Group     BP      Pulse      Resp      Temp  Temp src      SpO2      Weight      Height      Head Circumference      Peak Flow      Pain Score      Pain Loc      Pain Edu?      Excl. in GC?    No data found.  Updated Vital Signs BP (!) 152/85 (BP Location: Right Arm)   Pulse 90   Temp 98.9 F (37.2 C) (Oral)   Resp 20   SpO2 96%   Visual Acuity Right Eye Distance:   Left Eye Distance:   Bilateral Distance:    Right Eye Near:   Left Eye Near:    Bilateral Near:     Physical Exam Vitals and nursing note reviewed.  Constitutional:      General: He is not in acute distress.    Appearance: He is well-developed and well-nourished. He is not ill-appearing.  HENT:     Head:  Normocephalic and atraumatic.     Right Ear: Tympanic membrane normal.     Left Ear: Tympanic membrane normal.     Nose: Nose normal.     Mouth/Throat:     Mouth: Mucous membranes are moist.     Pharynx: Oropharynx is clear.  Eyes:     Conjunctiva/sclera: Conjunctivae normal.  Cardiovascular:     Rate and Rhythm: Normal rate and regular rhythm.     Heart sounds: Normal heart sounds.  Pulmonary:     Effort: Pulmonary effort is normal. No respiratory distress.     Breath sounds: Normal breath sounds. No wheezing.  Abdominal:     Palpations: Abdomen is soft.     Tenderness: There is no abdominal tenderness.  Musculoskeletal:        General: No edema.     Cervical back: Neck supple.  Skin:    General: Skin is warm and dry.  Neurological:     General: No focal deficit present.     Mental Status: He is alert and oriented to person, place, and time.     Gait: Gait normal.  Psychiatric:        Mood and Affect: Mood and affect and mood normal.        Behavior: Behavior normal.      UC Treatments / Results  Labs (all labs ordered are listed, but only abnormal results are displayed) Labs Reviewed - No data to display  EKG   Radiology No results found.  Procedures Procedures (including critical care time)  Medications Ordered in UC Medications - No data to display  Initial Impression / Assessment and Plan / UC Course  I have reviewed the triage vital signs and the nursing notes.  Pertinent labs & imaging results that were available during my care of the patient were reviewed by me and considered in my medical decision making (see chart for details).   Dizziness, chest tightness. EKG shows sinus rhythm, rate 83, no ST elevation, compared to previous from 04/28/2020. Patient declines transfer to the ED. Refill on albuterol inhaler prescribed. Instructed patient to follow-up with his allergist. Instructed him to go to the ED if he has continued or worsening symptoms. He agrees  to plan of care.   Final Clinical Impressions(s) / UC Diagnoses   Final diagnoses:  Dizziness  Chest tightness     Discharge Instructions     Use the albuterol inhaler as directed.    Follow-up  with your allergist listed below.        ED Prescriptions    Medication Sig Dispense Auth. Provider   albuterol (VENTOLIN HFA) 108 (90 Base) MCG/ACT inhaler Inhale 2 puffs into the lungs every 4 (four) hours as needed for wheezing or shortness of breath. 18 g Mickie Bail, NP     PDMP not reviewed this encounter.   Mickie Bail, NP 10/15/20 [phone]

## 2020-10-15 NOTE — Discharge Instructions (Signed)
Use the albuterol inhaler as directed.    Follow-up with your allergist listed below.

## 2020-11-01 ENCOUNTER — Other Ambulatory Visit: Payer: Self-pay

## 2020-11-01 ENCOUNTER — Emergency Department (HOSPITAL_COMMUNITY)
Admission: EM | Admit: 2020-11-01 | Discharge: 2020-11-01 | Disposition: A | Discharge status: Home / Self Care | Payer: Medicaid Other | Attending: Emergency Medicine | Admitting: Emergency Medicine

## 2020-11-01 ENCOUNTER — Emergency Department (HOSPITAL_COMMUNITY): Payer: Medicaid Other

## 2020-11-01 ENCOUNTER — Encounter (HOSPITAL_COMMUNITY): Payer: Self-pay | Admitting: Pharmacy Technician

## 2020-11-01 DIAGNOSIS — Z5321 Procedure and treatment not carried out due to patient leaving prior to being seen by health care provider: Secondary | ICD-10-CM | POA: Diagnosis not present

## 2020-11-01 DIAGNOSIS — J45901 Unspecified asthma with (acute) exacerbation: Secondary | ICD-10-CM | POA: Diagnosis not present

## 2020-11-01 MED ORDER — ALBUTEROL SULFATE HFA 108 (90 BASE) MCG/ACT IN AERS: 2.0000 | INHALATION_SPRAY | RESPIRATORY_TRACT | Status: DC | PRN

## 2020-11-01 NOTE — ED Notes (Signed)
Pt called for vitals several times no answer. Pt not seen in lobby.

## 2020-11-01 NOTE — ED Notes (Signed)
Pt name called twice no answer 

## 2020-11-01 NOTE — ED Triage Notes (Signed)
Pt bib ems from home with reports of asthma exacerbation. Lung sounds are clear in all fields. Pt got in an argument with his dad which triggered this event. 96% RA.

## 2021-01-08 ENCOUNTER — Other Ambulatory Visit: Payer: Self-pay

## 2021-01-08 ENCOUNTER — Encounter (HOSPITAL_COMMUNITY): Payer: Self-pay

## 2021-01-08 ENCOUNTER — Ambulatory Visit (HOSPITAL_COMMUNITY)
Admission: EM | Admit: 2021-01-08 | Discharge: 2021-01-08 | Disposition: A | Discharge status: Home / Self Care | Payer: Medicaid Other | Attending: Family Medicine | Admitting: Family Medicine

## 2021-01-08 DIAGNOSIS — R197 Diarrhea, unspecified: Secondary | ICD-10-CM | POA: Diagnosis not present

## 2021-01-08 DIAGNOSIS — R1084 Generalized abdominal pain: Secondary | ICD-10-CM | POA: Diagnosis not present

## 2021-01-08 DIAGNOSIS — R112 Nausea with vomiting, unspecified: Secondary | ICD-10-CM | POA: Diagnosis not present

## 2021-01-08 DIAGNOSIS — R509 Fever, unspecified: Secondary | ICD-10-CM | POA: Diagnosis not present

## 2021-01-08 MED ORDER — ONDANSETRON 4 MG PO TBDP
ORAL_TABLET | ORAL | Status: AC
  Filled 2021-01-08: qty 1

## 2021-01-08 MED ORDER — ONDANSETRON 4 MG PO TBDP
4.0000 mg | ORAL_TABLET | Freq: Once | ORAL | Status: AC
  Administered 2021-01-08: 4 mg via ORAL

## 2021-01-08 MED ORDER — ONDANSETRON 4 MG PO TBDP: 4.0000 mg | ORAL_TABLET | Freq: Three times a day (TID) | ORAL | 0 refills | Status: DC | PRN

## 2021-01-08 NOTE — ED Triage Notes (Signed)
Pt c/o vomiting and coughing up blood. He states he also been noticing blood coming from his rectum. Pt states he has a sire throat. He reports he ate chicken last night from a corner store and believes he has food poisoning.

## 2021-01-09 NOTE — ED Provider Notes (Signed)
[facility]   [id number] 01/08/21 Arrival Time: 1925  ASSESSMENT & PLAN:  1. Generalized abdominal pain   2. Nausea vomiting and diarrhea   3. Fever, unspecified fever cause    Given reported symptoms with fever/abd pain, I recommend ED evaluation. Reports "I'll think about it".  Stable upon discharge.  Meds ordered this encounter  Medications  . ondansetron (ZOFRAN-ODT) 4 MG disintegrating tablet    Sig: Take 1 tablet (4 mg total) by mouth every 8 (eight) hours as needed for nausea or vomiting.    Dispense:  15 tablet    Refill:  0  . ondansetron (ZOFRAN-ODT) disintegrating tablet 4 mg    Reviewed expectations re: course of current medical issues. Questions answered. Outlined signs and symptoms indicating need for more acute intervention. Patient verbalized understanding. After Visit Summary given.   SUBJECTIVE: History from: patient.  Jeremy Copeland is a 20 y.o. male who presents with complaint of non-bilious, non-bloody intermittent n/v "with blood in it"; also with reported bloody diarrhea. Onset abrupt yest evening. Abdominal discomfort: moderate and aching and dull. Symptoms are gradually worsening since beginning. Aggravating factors: eating. Alleviating factors: none identified. Associated symptoms: fatigue. He denies fever. Appetite: decreased. PO intake: decreased. Ambulatory without assistance. Urinary symptoms: none. Sick contacts: none. Recent travel or camping: none. OTC treatment: none.  History reviewed. No pertinent surgical history.   OBJECTIVE:  Vitals:   01/08/21 2022  BP: (!) 147/123  Pulse: (!) 107  Resp: 20  Temp: (!) 101 F (38.3 C)  TempSrc: Oral  SpO2: 99%    Abnormal VS noted.  General appearance: alert; supine on exam table moaning about abdominal pain Oropharynx: moist without inflammation Lungs: clear to auscultation bilaterally; unlabored Heart: regular; slight tachycardia noted Abdomen: soft; non-distended; vague  generalized abdominal tenderness; bowel sounds present; no masses or organomegaly; no frank guarding or rebound tenderness; "just hurts when you push on it" Back: no CVA tenderness Extremities: no edema; symmetrical with no gross deformities Skin: warm; dry Neurologic: normal gait Psychological: alert and cooperative; normal mood and affect   No Known Allergies                                             Past Medical History:  Diagnosis Date  . Asthma    Social History   Socioeconomic History  . Marital status: Single    Spouse name: Not on file  . Number of children: Not on file  . Years of education: Not on file  . Highest education level: Not on file  Occupational History  . Not on file  Tobacco Use  . Smoking status: Never Smoker  . Smokeless tobacco: Never Used  Vaping Use  . Vaping Use: Never used  Substance and Sexual Activity  . Alcohol use: Never  . Drug use: Never  . Sexual activity: Yes  Other Topics Concern  . Not on file  Social History Narrative  . Not on file   Social Determinants of Health   Financial Resource Strain: Not on file  Food Insecurity: Not on file  Transportation Needs: Not on file  Physical Activity: Not on file  Stress: Not on file  Social Connections: Not on file  Intimate Partner Violence: Not on file   Family History  Problem Relation Age of Onset  . Healthy Mother   . Healthy Father  Mardella Layman, MD 01/09/21 [phone]

## 2021-01-14 ENCOUNTER — Ambulatory Visit (HOSPITAL_COMMUNITY)
Admission: EM | Admit: 2021-01-14 | Discharge: 2021-01-14 | Disposition: A | Discharge status: Home / Self Care | Payer: Medicaid Other | Attending: Emergency Medicine | Admitting: Emergency Medicine

## 2021-01-14 ENCOUNTER — Encounter (HOSPITAL_COMMUNITY): Payer: Self-pay

## 2021-01-14 DIAGNOSIS — K649 Unspecified hemorrhoids: Secondary | ICD-10-CM | POA: Insufficient documentation

## 2021-01-14 LAB — CBC
HCT: 42.7 % (ref 39.0–52.0)
Hemoglobin: 14.6 g/dL (ref 13.0–17.0)
MCH: 28.6 pg (ref 26.0–34.0)
MCHC: 34.2 g/dL (ref 30.0–36.0)
MCV: 83.6 fL (ref 80.0–100.0)
Platelets: 337 10*3/uL (ref 150–400)
RBC: 5.11 MIL/uL (ref 4.22–5.81)
RDW: 11.8 % (ref 11.5–15.5)
WBC: 7.8 10*3/uL (ref 4.0–10.5)
nRBC: 0 % (ref 0.0–0.2)

## 2021-01-14 NOTE — ED Provider Notes (Signed)
[facility]    CSN: [id number] Arrival date & time: 01/14/21  1623      History   Chief Complaint Chief Complaint  Patient presents with  . Rectal Bleeding    HPI Jeremy Copeland is a 20 y.o. male.   Patient presents with rectal bleeding for four months. Initially started off light but has become a consistent bleed causing patient to take multiple showers and change underwear during day. Blood is described as bright red color mixed a burgundy colo. Denies presence of clots, itching. Noticed a lump dangling from rectum that has started to make sitting in certain positions uncomfortable. Daily formed bowel movements. Recently had issue with abdominal causing blood in vomit but has resolved.   Past Medical History:  Diagnosis Date  . Asthma     Patient Active Problem List   Diagnosis Date Noted  . Asthma, not well controlled 07/17/2020  . Chronic rhinitis 07/17/2020    History reviewed. No pertinent surgical history.     Home Medications    Prior to Admission medications   Medication Sig Start Date End Date Taking? Authorizing Provider  albuterol (VENTOLIN HFA) 108 (90 Base) MCG/ACT inhaler Inhale 2 puffs into the lungs every 4 (four) hours as needed for wheezing or shortness of breath. 10/15/20   Mickie Bail, NP  budesonide-formoterol (SYMBICORT) 160-4.5 MCG/ACT inhaler Inhale 2 puffs into the lungs in the morning and at bedtime. with spacer and rinse mouth afterwards. 08/14/20   Ellamae Sia, DO  cetirizine (ZYRTEC ALLERGY) 10 MG tablet Take 1 tablet (10 mg total) by mouth daily. 06/24/20   Moshe Cipro, NP  dicyclomine (BENTYL) 20 MG tablet Take 1 tablet (20 mg total) by mouth 4 (four) times daily -  before meals and at bedtime. 05/12/20   Wieters, Hallie C, PA-C  fluticasone (FLONASE) 50 MCG/ACT nasal spray Place 1-2 sprays into both nostrils daily for 7 days. 05/12/20 05/19/20  Wieters, Hallie C, PA-C  montelukast (SINGULAIR) 10 MG tablet Take 1 tablet  (10 mg total) by mouth at bedtime. 07/17/20   Ellamae Sia, DO  ondansetron (ZOFRAN-ODT) 4 MG disintegrating tablet Take 1 tablet (4 mg total) by mouth every 8 (eight) hours as needed for nausea or vomiting. 01/08/21   Mardella Layman, MD    Family History Family History  Problem Relation Age of Onset  . Healthy Mother   . Healthy Father     Social History Social History   Tobacco Use  . Smoking status: Never Smoker  . Smokeless tobacco: Never Used  Vaping Use  . Vaping Use: Never used  Substance Use Topics  . Alcohol use: Never  . Drug use: Never     Allergies   Patient has no known allergies.   Review of Systems Review of Systems  Constitutional: Negative.   Respiratory: Negative.   Cardiovascular: Negative.   Gastrointestinal: Negative.   Genitourinary: Negative.   Skin: Negative.   Neurological: Negative.      Physical Exam Triage Vital Signs ED Triage Vitals  Enc Vitals Group     BP 01/14/21 1734 138/80     Pulse Rate 01/14/21 1734 86     Resp 01/14/21 1734 19     Temp 01/14/21 1734 98.3 F (36.8 C)     Temp Source 01/14/21 1734 Oral     SpO2 01/14/21 1734 98 %     Weight --      Height --      Head Circumference --  Peak Flow --      Pain Score 01/14/21 1731 10     Pain Loc --      Pain Edu? --      Excl. in GC? --    No data found.  Updated Vital Signs BP 138/80 (BP Location: Right Arm)   Pulse 86   Temp 98.3 F (36.8 C) (Oral)   Resp 19   SpO2 98%   Visual Acuity Right Eye Distance:   Left Eye Distance:   Bilateral Distance:    Right Eye Near:   Left Eye Near:    Bilateral Near:     Physical Exam Constitutional:      Appearance: Normal appearance. He is normal weight.  HENT:     Head: Normocephalic.  Eyes:     Extraocular Movements: Extraocular movements intact.  Pulmonary:     Effort: Pulmonary effort is normal.  Genitourinary:    Rectum: External hemorrhoid present. No anal fissure.    Musculoskeletal:         General: Normal range of motion.     Cervical back: Normal range of motion.  Skin:    General: Skin is warm and dry.  Neurological:     General: No focal deficit present.     Mental Status: He is alert and oriented to person, place, and time. Mental status is at baseline.  Psychiatric:        Mood and Affect: Mood normal.        Behavior: Behavior normal.        Thought Content: Thought content normal.        Judgment: Judgment normal.      UC Treatments / Results  Labs (all labs ordered are listed, but only abnormal results are displayed) Labs Reviewed  CBC    EKG   Radiology No results found.  Procedures Procedures (including critical care time)  Medications Ordered in UC Medications - No data to display  Initial Impression / Assessment and Plan / UC Course  I have reviewed the triage vital signs and the nursing notes.  Pertinent labs & imaging results that were available during my care of the patient were reviewed by me and considered in my medical decision making (see chart for details).  External hemorrhoid  1. Declined anal cream 2. CBC pending  3. Given general surgery referral for evaluation of removal of site due to peristent bleeding Final Clinical Impressions(s) / UC Diagnoses   Final diagnoses:  Bleeding hemorrhoid     Discharge Instructions     Follow up with general surgery for evaluation of hemorrhoid   Lab results in 24 hours, you will be notified of any concerning result    ED Prescriptions    None     PDMP not reviewed this encounter.   Valinda Hoar, NP 01/14/21 1840

## 2021-01-14 NOTE — Discharge Instructions (Addendum)
Follow up with general surgery for evaluation of hemorrhoid   Lab results in 24 hours, you will be notified of any concerning result

## 2021-01-14 NOTE — ED Triage Notes (Signed)
Pt c/o a rectal bleeding x 4 months. He states he thinks he has hemorrhoids.

## 2021-02-01 ENCOUNTER — Ambulatory Visit (HOSPITAL_COMMUNITY)
Admission: EM | Admit: 2021-02-01 | Discharge: 2021-02-01 | Disposition: A | Discharge status: Home / Self Care | Payer: Medicaid Other | Attending: Medical Oncology | Admitting: Medical Oncology

## 2021-02-01 ENCOUNTER — Other Ambulatory Visit: Payer: Self-pay

## 2021-02-01 ENCOUNTER — Encounter (HOSPITAL_COMMUNITY): Payer: Self-pay

## 2021-02-01 DIAGNOSIS — R1084 Generalized abdominal pain: Secondary | ICD-10-CM | POA: Diagnosis not present

## 2021-02-01 DIAGNOSIS — K649 Unspecified hemorrhoids: Secondary | ICD-10-CM

## 2021-02-01 DIAGNOSIS — R112 Nausea with vomiting, unspecified: Secondary | ICD-10-CM | POA: Diagnosis not present

## 2021-02-01 MED ORDER — HYDROCORTISONE (PERIANAL) 2.5 % EX CREA: 1.0000 "application " | TOPICAL_CREAM | Freq: Two times a day (BID) | CUTANEOUS | 0 refills | Status: DC

## 2021-02-01 MED ORDER — ONDANSETRON 4 MG PO TBDP: 4.0000 mg | ORAL_TABLET | Freq: Three times a day (TID) | ORAL | 0 refills | Status: DC | PRN

## 2021-02-01 NOTE — ED Provider Notes (Signed)
[facility]    CSN: [id number] Arrival date & time: 02/01/21  1416      History   Chief Complaint Chief Complaint  Patient presents with   Abdominal Pain   Nausea   Emesis   Dizziness    HPI Jekhi Bolin is a 20 y.o. male.   HPI  Abdominal Pain: Pt reports that he has had nausea and vomiting for the past 2 weeks. Current abdominal pain is located in the left lower quadrant to epigastric area. He denies diarrhea or constipation. He reports one episodes of small amount of potential bloody vomit but then denies this later once I express my concerns. He has used zofran with success but needs a refill. He states that he does not wish to go to the ER but instead wants a refill of his medication and to go home. When asked about fall risks he states that he has had mild dizziness lately which he attributes to chronic blood loss for hemorrhoids that he reports are going to be surgically removed soon. Again he then retracts this after I express my concerns and limitations.    Past Medical History:  Diagnosis Date   Asthma     Patient Active Problem List   Diagnosis Date Noted   Asthma, not well controlled 07/17/2020   Chronic rhinitis 07/17/2020    History reviewed. No pertinent surgical history.   Home Medications    Prior to Admission medications   Medication Sig Start Date End Date Taking? Authorizing Provider  ondansetron (ZOFRAN-ODT) 4 MG disintegrating tablet Take 1 tablet (4 mg total) by mouth every 8 (eight) hours as needed for nausea or vomiting. 01/08/21  Yes Hagler, Arlys John, MD  albuterol (VENTOLIN HFA) 108 (90 Base) MCG/ACT inhaler Inhale 2 puffs into the lungs every 4 (four) hours as needed for wheezing or shortness of breath. 10/15/20   Mickie Bail, NP  budesonide-formoterol (SYMBICORT) 160-4.5 MCG/ACT inhaler Inhale 2 puffs into the lungs in the morning and at bedtime. with spacer and rinse mouth afterwards. 08/14/20   Ellamae Sia, DO  cetirizine  (ZYRTEC ALLERGY) 10 MG tablet Take 1 tablet (10 mg total) by mouth daily. 06/24/20   Moshe Cipro, NP  dicyclomine (BENTYL) 20 MG tablet Take 1 tablet (20 mg total) by mouth 4 (four) times daily -  before meals and at bedtime. 05/12/20   Wieters, Hallie C, PA-C  fluticasone (FLONASE) 50 MCG/ACT nasal spray Place 1-2 sprays into both nostrils daily for 7 days. 05/12/20 05/19/20  Wieters, Hallie C, PA-C  montelukast (SINGULAIR) 10 MG tablet Take 1 tablet (10 mg total) by mouth at bedtime. 07/17/20   Ellamae Sia, DO    Family History Family History  Problem Relation Age of Onset   Healthy Mother    Healthy Father    Heart disease Maternal Grandfather    Heart disease Maternal Uncle    Heart disease Maternal Uncle    Heart disease Cousin     Social History Social History   Tobacco Use   Smoking status: Never   Smokeless tobacco: Never  Vaping Use   Vaping Use: Former  Substance Use Topics   Alcohol use: Yes    Comment: reports occasionally will drink 16-18 shots in one night   Drug use: Not Currently     Allergies   Patient has no known allergies.   Review of Systems Review of Systems  As stated above in HPI Physical Exam Triage Vital Signs ED Triage Vitals  Enc Vitals Group     BP 02/01/21 1430 (!) 143/78     Pulse Rate 02/01/21 1430 92     Resp 02/01/21 1430 18     Temp 02/01/21 1430 98.4 F (36.9 C)     Temp src --      SpO2 02/01/21 1430 96 %     Weight --      Height --      Head Circumference --      Peak Flow --      Pain Score 02/01/21 1424 7     Pain Loc --      Pain Edu? --      Excl. in GC? --    No data found.  Updated Vital Signs BP (!) 143/78   Pulse 92   Temp 98.4 F (36.9 C)   Resp 18   SpO2 96%   Physical Exam Vitals and nursing note reviewed.  Constitutional:      General: He is not in acute distress.    Appearance: He is well-developed. He is not ill-appearing, toxic-appearing or diaphoretic.  HENT:     Head: Normocephalic  and atraumatic.     Mouth/Throat:     Mouth: Mucous membranes are moist.  Eyes:     Extraocular Movements: Extraocular movements intact.     Pupils: Pupils are equal, round, and reactive to light.     Comments: No pallor  Cardiovascular:     Rate and Rhythm: Normal rate and regular rhythm.  Pulmonary:     Effort: Pulmonary effort is normal.     Breath sounds: Normal breath sounds.  Abdominal:     General: Abdomen is flat. Bowel sounds are normal. There is no distension or abdominal bruit. There are no signs of injury.     Palpations: Abdomen is soft. There is no shifting dullness, fluid wave, hepatomegaly, splenomegaly, mass or pulsatile mass.     Tenderness: There is no abdominal tenderness. There is no guarding or rebound. Negative signs include Murphy's sign, Rovsing's sign and McBurney's sign.     Hernia: No hernia is present.  Skin:    General: Skin is warm.     Coloration: Skin is not jaundiced.  Neurological:     Mental Status: He is alert.     UC Treatments / Results  Labs (all labs ordered are listed, but only abnormal results are displayed) Labs Reviewed - No data to display  EKG   Radiology No results found.  Procedures Procedures (including critical care time)  Medications Ordered in UC Medications - No data to display  Initial Impression / Assessment and Plan / UC Course  I have reviewed the triage vital signs and the nursing notes.  Pertinent labs & imaging results that were available during my care of the patient were reviewed by me and considered in my medical decision making (see chart for details).     New.  Patient has a few concerning symptoms but when I discussed my concerns and my recommendation for him to go to the emergency room for further evaluation he retracts these concerning statements.  It is difficult to assess what his symptoms truly are.  I will say that his vitals are reassuring and that his abdominal examination is without any  tenderness.  We discussed that should symptoms worsen at all or he have any episodes of bloody vomiting he needs to go to the emergency room.  He reports that he is agreeable.  He asked for  a work note. Sending in zofran and Anusol per his request.  Final Clinical Impressions(s) / UC Diagnoses   Final diagnoses:  None   Discharge Instructions   None    ED Prescriptions   None    PDMP not reviewed this encounter.   Rushie Chestnut, [place] 02/01/21 1500

## 2021-02-01 NOTE — ED Triage Notes (Addendum)
Pt reports nausea and vomiting for 2 weeks. Pt also reports abdominal pain from lower left side radiating to above bellybutton. Pt adds that he is feeling dizzy and believes it is from pre-existing hemorrhoid.

## 2021-02-20 ENCOUNTER — Ambulatory Visit: Payer: Self-pay | Admitting: Surgery

## 2021-02-20 NOTE — H&P (Signed)
History of Present Illness (Jeremy Copeland L. Freida Busman MD; 02/20/2021 10:49 AM) The patient is a 20 year old male who presents with hemorrhoids.HPI: Mr. Jeremy Copeland is a 20 yo male who was referred for evaluation of a hemorrhoid. He has noticed a hemorrhoid with associated bleeding since about April. He denies constipation and says that he actually has frequent bowel movements, about 6-7 per day. He was recently seen in urgent care in May, at which time his hemoglobin was 14. The hemorrhoid bothers him a lot and has made it difficult for him to work due to the discomfort. He says he has had to leave work early a lot and has missed several days. He has never had a colonoscopy.  PMH: asthma  PSH: none  FHx: Denies family history of colorectal cancers.    Past Surgical History ([name], CNA; 02/20/2021 9:50 AM) No pertinent past surgical history    Diagnostic Studies History ([name], CNA; 02/20/2021 9:50 AM) Colonoscopy   never  Allergies ([name], CNA; 02/20/2021 9:51 AM) No Known Drug Allergies   [02/20/2021]:  Medication History ([name], CNA; 02/20/2021 9:51 AM) Medications Reconciled   Family History ([name], CNA; 02/20/2021 9:50 AM) Family history unknown   First Degree Relatives    Other Problems ([name], CNA; 02/20/2021 9:50 AM) Asthma      Review of Systems ([name] CNA; 02/20/2021 9:50 AM) General Not Present- Appetite Loss, Chills, Fatigue, Fever, Night Sweats, Weight Gain and Weight Loss. Skin Not Present- Change in Wart/Mole, Dryness, Hives, Jaundice, New Lesions, Non-Healing Wounds, Rash and Ulcer. Cardiovascular Not Present- Chest Pain, Difficulty Breathing Lying Down, Leg Cramps, Palpitations, Rapid Heart Rate, Shortness of Breath and Swelling of Extremities. Gastrointestinal Present- Hemorrhoids. Not Present- Abdominal Pain, Bloating, Bloody Stool, Change in Bowel Habits, Chronic diarrhea, Constipation, Difficulty  Swallowing, Excessive gas, Gets full quickly at meals, Indigestion, Nausea, Rectal Pain and Vomiting. Male Genitourinary Not Present- Blood in Urine, Change in Urinary Stream, Frequency, Impotence, Nocturia, Painful Urination, Urgency and Urine Leakage. Musculoskeletal Not Present- Back Pain, Joint Pain, Joint Stiffness, Muscle Pain, Muscle Weakness and Swelling of Extremities. Neurological Not Present- Decreased Memory, Fainting, Headaches, Numbness, Seizures, Tingling, Tremor, Trouble walking and Weakness. Psychiatric Not Present- Anxiety, Bipolar, Change in Sleep Pattern, Depression, Fearful and Frequent crying. Hematology Not Present- Blood Thinners, Easy Bruising, Excessive bleeding, Gland problems, HIV and Persistent Infections.  Vitals ([name] CNA; 02/20/2021 9:52 AM) 02/20/2021 9:51 AM Weight: 222.25 lb   Height: 71 in  Body Surface Area: 2.21 m   Body Mass Index: 31 kg/m   Temp.: 98.4 F    Pulse: 89 (Regular)    P.OX: 96% (Room air) BP: 140/82(Sitting, Left Arm, Standard)       Physical Exam (Markevious Ehmke L. Freida Busman MD; 02/20/2021 10:50 AM) The physical exam findings are as follows: Note:  Constitutional: No acute distress; conversant; no deformities Neuro: alert and oriented; cranial nerves grossly in tact; no focal deficits Eyes: Moist conjunctiva; anicteric sclerae; extraocular movements in tact Neck: Trachea midline Lungs: Normal respiratory effort; lungs clear to auscultation bilaterally; symmetric chest wall expansion CV: Regular rate and rhythm; no murmurs; no pitting edema Anorectal: mass to the left of the anal verge, appears to be a prolapsed internal hemorrhoid with ulcerated mucosa. No active bleeding. No external hemorrhoids. No masses on digital rectal exam. Anoscopy could not be performed due to patient discomfort. MSK: Normal gait and station; no clubbing/cyanosis Psychiatric: Appropriate affect; alert and oriented 3    Assessment & Plan [facility]  Burna Mortimer  MD; 02/20/2021 10:52 AM) PROLAPSED INTERNAL HEMORRHOIDS, GRADE 4 (K64.3) Story: 20 yo male referred with a hemorrhoid. On exam this appears to be a prolapsed internal hemorrhoid, but internal exam was very limited due to patient discomfort. I recommended an exam under anesthesia to rule out other abnormalities or underlying masses, at which time hemorrhoidectomy will also be performed. I discussed the details of this procedure with the patient. As he is having severe symptoms that limit his activities, I will plan to schedule this next week. I discussed that he can expect perianal discomfort for several weeks after surgery. He will need to do a rectal prep prior to surgery and was provided instructions. He will be contacted to schedule surgery for next week.  Sophronia Simas, MD [facility] General, Hepatobiliary and Pancreatic Surgery 02/20/21 10:55 AM

## 2021-02-20 NOTE — H&P (View-Only) (Signed)
History of Present Illness (Jeremy Copeland L. Jeremy Farrier MD; 02/20/2021 10:49 AM) The patient is a 20 year old male who presents with hemorrhoids.HPI: Jeremy Copeland is a 20 yo male who was referred for evaluation of a hemorrhoid. He has noticed a hemorrhoid with associated bleeding since about April. He denies constipation and says that he actually has frequent bowel movements, about 6-7 per day. He was recently seen in urgent care in May, at which time his hemoglobin was 14. The hemorrhoid bothers him a lot and has made it difficult for him to work due to the discomfort. He says he has had to leave work early a lot and has missed several days. He has never had a colonoscopy.  PMH: asthma  PSH: none  FHx: Denies family history of colorectal cancers.    Past Surgical History ([name], CNA; 02/20/2021 9:50 AM) No pertinent past surgical history    Diagnostic Studies History ([name], CNA; 02/20/2021 9:50 AM) Colonoscopy   never  Allergies ([name], CNA; 02/20/2021 9:51 AM) No Known Drug Allergies   [02/20/2021]:  Medication History ([name], CNA; 02/20/2021 9:51 AM) Medications Reconciled   Family History ([name], CNA; 02/20/2021 9:50 AM) Family history unknown   First Degree Relatives    Other Problems ([name], CNA; 02/20/2021 9:50 AM) Asthma      Review of Systems ([name] CNA; 02/20/2021 9:50 AM) General Not Present- Appetite Loss, Chills, Fatigue, Fever, Night Sweats, Weight Gain and Weight Loss. Skin Not Present- Change in Wart/Mole, Dryness, Hives, Jaundice, New Lesions, Non-Healing Wounds, Rash and Ulcer. Cardiovascular Not Present- Chest Pain, Difficulty Breathing Lying Down, Leg Cramps, Palpitations, Rapid Heart Rate, Shortness of Breath and Swelling of Extremities. Gastrointestinal Present- Hemorrhoids. Not Present- Abdominal Pain, Bloating, Bloody Stool, Change in Bowel Habits, Chronic diarrhea, Constipation, Difficulty  Swallowing, Excessive gas, Gets full quickly at meals, Indigestion, Nausea, Rectal Pain and Vomiting. Male Genitourinary Not Present- Blood in Urine, Change in Urinary Stream, Frequency, Impotence, Nocturia, Painful Urination, Urgency and Urine Leakage. Musculoskeletal Not Present- Back Pain, Joint Pain, Joint Stiffness, Muscle Pain, Muscle Weakness and Swelling of Extremities. Neurological Not Present- Decreased Memory, Fainting, Headaches, Numbness, Seizures, Tingling, Tremor, Trouble walking and Weakness. Psychiatric Not Present- Anxiety, Bipolar, Change in Sleep Pattern, Depression, Fearful and Frequent crying. Hematology Not Present- Blood Thinners, Easy Bruising, Excessive bleeding, Gland problems, HIV and Persistent Infections.  Vitals ([name] CNA; 02/20/2021 9:52 AM) 02/20/2021 9:51 AM Weight: 222.25 lb   Height: 71 in  Body Surface Area: 2.21 m   Body Mass Index: 31 kg/m   Temp.: 98.4 F    Pulse: 89 (Regular)    P.OX: 96% (Room air) BP: 140/82(Sitting, Left Arm, Standard)       Physical Exam (Shine Mikes L. Daine Croker MD; 02/20/2021 10:50 AM) The physical exam findings are as follows: Note:  Constitutional: No acute distress; conversant; no deformities Neuro: alert and oriented; cranial nerves grossly in tact; no focal deficits Eyes: Moist conjunctiva; anicteric sclerae; extraocular movements in tact Neck: Trachea midline Lungs: Normal respiratory effort; lungs clear to auscultation bilaterally; symmetric chest wall expansion CV: Regular rate and rhythm; no murmurs; no pitting edema Anorectal: mass to the left of the anal verge, appears to be a prolapsed internal hemorrhoid with ulcerated mucosa. No active bleeding. No external hemorrhoids. No masses on digital rectal exam. Anoscopy could not be performed due to patient discomfort. MSK: Normal gait and station; no clubbing/cyanosis Psychiatric: Appropriate affect; alert and oriented 3    Assessment & Plan (Dene Landsberg   Burna Mortimer  MD; 02/20/2021 10:52 AM) PROLAPSED INTERNAL HEMORRHOIDS, GRADE 4 (K64.3) Story: 20 yo male referred with a hemorrhoid. On exam this appears to be a prolapsed internal hemorrhoid, but internal exam was very limited due to patient discomfort. I recommended an exam under anesthesia to rule out other abnormalities or underlying masses, at which time hemorrhoidectomy will also be performed. I discussed the details of this procedure with the patient. As he is having severe symptoms that limit his activities, I will plan to schedule this next week. I discussed that he can expect perianal discomfort for several weeks after surgery. He will need to do a rectal prep prior to surgery and was provided instructions. He will be contacted to schedule surgery for next week.  Sophronia Simas, MD [facility] General, Hepatobiliary and Pancreatic Surgery 02/20/21 10:55 AM

## 2021-02-27 ENCOUNTER — Encounter (HOSPITAL_COMMUNITY): Payer: Self-pay | Admitting: Surgery

## 2021-02-27 NOTE — Progress Notes (Signed)
DUE TO COVID-19 ONLY ONE VISITOR IS ALLOWED TO COME WITH YOU AND STAY IN THE WAITING ROOM ONLY DURING PRE OP AND PROCEDURE DAY OF SURGERY.   PCP - none Cardiologist - n/a  Chest x-ray - 11/01/20 (2V) EKG - 11/01/20 Stress Test - n/a ECHO - n/a Cardiac Cath - n/a  Sleep Study -  n/a CPAP - none  Fleets enema tonight at 2200 and morning of surgery per MD.  Anesthesia review: n/a  STOP now taking any Aspirin (unless otherwise instructed by your surgeon), Aleve, Naproxen, Ibuprofen, Motrin, Advil, Goody's, BC's, all herbal medications, fish oil, and all vitamins.   Coronavirus Screening Covid test n/a - Ambulatory Surgery  Do you have any of the following symptoms:  Cough yes/no: No Fever (>100.70F)  yes/no: No Runny nose yes/no: No Sore throat yes/no: No Difficulty breathing/shortness of breath  yes/no: No  Have you traveled in the last 14 days and where? yes/no: No  Patient verbalized understanding of instructions that were given via phone.

## 2021-03-19 ENCOUNTER — Other Ambulatory Visit: Payer: Self-pay

## 2021-03-19 ENCOUNTER — Encounter (HOSPITAL_COMMUNITY): Payer: Self-pay | Admitting: Surgery

## 2021-03-19 NOTE — Progress Notes (Signed)
Mr Unrein denies chest pain or shortness of breath. Patient denies having any s/s of Covid in her household.  Patient denies any known exposure to Covid.   Dr. Freida Busman has ordered Fleet Enemas x 2, Mr. Butt said, I think she said something like that, "it is on the paper , but I do not have it with me at this time." On July 6, the nurse entered that patient should have a Fleet Enema the night before and 1 in am, Mr. Thau reports ,"that that sounds right."  I asked Mr. Mohler if he has purchased the Enemas, he said no.  I instructed patient to find the instructions and to go purchase 2 Fleet enemas and to follow the instructions. I told patient if he is not clean, that Dr. Freida Busman will not be able to see to do a biospy.  Mr. Lagrand said he will go get the enemas.

## 2021-03-19 NOTE — Anesthesia Preprocedure Evaluation (Addendum)
Anesthesia Evaluation  Patient identified by MRN, date of birth, ID band Patient awake    Reviewed: Allergy & Precautions, NPO status , Patient's Chart, lab work & pertinent test results  Airway Mallampati: II  TM Distance: >3 FB Neck ROM: Full    Dental no notable dental hx. (+) Teeth Intact, Chipped, Dental Advisory Given,    Pulmonary asthma (all out of inhalers since january ) ,  Poorly controlled asthma, lost all of his inhalers   Pulmonary exam normal breath sounds clear to auscultation       Cardiovascular negative cardio ROS Normal cardiovascular exam Rhythm:Regular Rate:Normal     Neuro/Psych PSYCHIATRIC DISORDERS Depression negative neurological ROS     GI/Hepatic negative GI ROS, Neg liver ROS,   Endo/Other  negative endocrine ROS  Renal/GU negative Renal ROS  negative genitourinary   Musculoskeletal negative musculoskeletal ROS (+)   Abdominal (+) + obese,   Peds  Hematology negative hematology ROS (+)   Anesthesia Other Findings hemorrhoid  Reproductive/Obstetrics negative OB ROS                            Anesthesia Physical Anesthesia Plan  ASA: 3  Anesthesia Plan: General   Post-op Pain Management:    Induction: Intravenous  PONV Risk Score and Plan: 2 and Ondansetron, Dexamethasone, Midazolam and Treatment may vary due to age or medical condition  Airway Management Planned: Oral ETT  Additional Equipment: None  Intra-op Plan:   Post-operative Plan: Extubation in OR  Informed Consent: I have reviewed the patients History and Physical, chart, labs and discussed the procedure including the risks, benefits and alternatives for the proposed anesthesia with the patient or authorized representative who has indicated his/her understanding and acceptance.     Dental advisory given  Plan Discussed with: CRNA  Anesthesia Plan Comments: (Poorly controlled asthma-  d/w patient and mother that he is at increased risk of pulmonary issues perioperatively, stressed the importance of getting new inhalers and restarting his maintenance medications )      Anesthesia Quick Evaluation

## 2021-03-20 ENCOUNTER — Encounter (HOSPITAL_COMMUNITY): Payer: Self-pay | Admitting: Surgery

## 2021-03-20 ENCOUNTER — Ambulatory Visit (HOSPITAL_COMMUNITY): Payer: Medicaid Other | Admitting: Certified Registered"

## 2021-03-20 ENCOUNTER — Ambulatory Visit (HOSPITAL_COMMUNITY)
Admission: RE | Admit: 2021-03-20 | Discharge: 2021-03-20 | Disposition: A | Discharge status: Home / Self Care | Payer: Medicaid Other | Source: Ambulatory Visit | Attending: Surgery | Admitting: Surgery

## 2021-03-20 ENCOUNTER — Encounter (HOSPITAL_COMMUNITY)
Admission: RE | Disposition: A | Discharge status: Home / Self Care | Payer: Self-pay | Source: Ambulatory Visit | Attending: Surgery

## 2021-03-20 DIAGNOSIS — Z79899 Other long term (current) drug therapy: Secondary | ICD-10-CM | POA: Insufficient documentation

## 2021-03-20 DIAGNOSIS — Z6831 Body mass index (BMI) 31.0-31.9, adult: Secondary | ICD-10-CM | POA: Insufficient documentation

## 2021-03-20 DIAGNOSIS — E669 Obesity, unspecified: Secondary | ICD-10-CM | POA: Insufficient documentation

## 2021-03-20 DIAGNOSIS — J45909 Unspecified asthma, uncomplicated: Secondary | ICD-10-CM | POA: Diagnosis not present

## 2021-03-20 DIAGNOSIS — L980 Pyogenic granuloma: Secondary | ICD-10-CM | POA: Insufficient documentation

## 2021-03-20 DIAGNOSIS — K648 Other hemorrhoids: Secondary | ICD-10-CM | POA: Diagnosis present

## 2021-03-20 HISTORY — PX: RECTAL EXAM UNDER ANESTHESIA: SHX6399

## 2021-03-20 HISTORY — DX: Depression, unspecified: F32.A

## 2021-03-20 HISTORY — PX: HEMORRHOID SURGERY: SHX153

## 2021-03-20 SURGERY — EXAM UNDER ANESTHESIA, RECTUM
Anesthesia: General

## 2021-03-20 MED ORDER — OXYCODONE HCL 5 MG PO TABS: 5.0000 mg | ORAL_TABLET | Freq: Once | ORAL | Status: DC | PRN

## 2021-03-20 MED ORDER — LACTATED RINGERS IV SOLN: INTRAVENOUS | Status: DC

## 2021-03-20 MED ORDER — ACETAMINOPHEN 500 MG PO TABS: 1000.0000 mg | ORAL_TABLET | Freq: Once | ORAL | Status: DC

## 2021-03-20 MED ORDER — OXYCODONE HCL 5 MG/5ML PO SOLN: 5.0000 mg | Freq: Once | ORAL | Status: DC | PRN

## 2021-03-20 MED ORDER — ROCURONIUM BROMIDE 10 MG/ML (PF) SYRINGE
PREFILLED_SYRINGE | INTRAVENOUS | Status: DC | PRN
  Administered 2021-03-20: 80 mg via INTRAVENOUS

## 2021-03-20 MED ORDER — ALBUTEROL SULFATE HFA 108 (90 BASE) MCG/ACT IN AERS
INHALATION_SPRAY | RESPIRATORY_TRACT | Status: DC | PRN
  Administered 2021-03-20: 3 via RESPIRATORY_TRACT
  Administered 2021-03-20: 8 via RESPIRATORY_TRACT

## 2021-03-20 MED ORDER — SUGAMMADEX SODIUM 200 MG/2ML IV SOLN
INTRAVENOUS | Status: DC | PRN
  Administered 2021-03-20: 390 mg via INTRAVENOUS

## 2021-03-20 MED ORDER — MIDAZOLAM HCL 2 MG/2ML IJ SOLN
INTRAMUSCULAR | Status: AC
  Filled 2021-03-20: qty 2

## 2021-03-20 MED ORDER — DEXAMETHASONE SODIUM PHOSPHATE 10 MG/ML IJ SOLN
INTRAMUSCULAR | Status: AC
  Filled 2021-03-20: qty 1

## 2021-03-20 MED ORDER — POLYETHYLENE GLYCOL 3350 17 G PO PACK: 17.0000 g | PACK | Freq: Every day | ORAL | 0 refills | Status: AC

## 2021-03-20 MED ORDER — SODIUM CHLORIDE (PF) 0.9 % IJ SOLN
INTRAMUSCULAR | Status: DC | PRN
Start: 2021-03-20 — End: 2021-03-20
  Administered 2021-03-20: 20 mL

## 2021-03-20 MED ORDER — DEXMEDETOMIDINE (PRECEDEX) IN NS 20 MCG/5ML (4 MCG/ML) IV SYRINGE
PREFILLED_SYRINGE | INTRAVENOUS | Status: DC | PRN
  Administered 2021-03-20: 12 ug via INTRAVENOUS

## 2021-03-20 MED ORDER — LIDOCAINE 2% (20 MG/ML) 5 ML SYRINGE
INTRAMUSCULAR | Status: AC
  Filled 2021-03-20: qty 5

## 2021-03-20 MED ORDER — KETOROLAC TROMETHAMINE 30 MG/ML IJ SOLN: 30.0000 mg | Freq: Once | INTRAMUSCULAR | Status: DC | PRN

## 2021-03-20 MED ORDER — HYDROCODONE-ACETAMINOPHEN 5-325 MG PO TABS: 1.0000 | ORAL_TABLET | Freq: Four times a day (QID) | ORAL | 0 refills | Status: DC | PRN

## 2021-03-20 MED ORDER — PROPOFOL 10 MG/ML IV BOLUS
INTRAVENOUS | Status: DC | PRN
  Administered 2021-03-20: 300 mg via INTRAVENOUS

## 2021-03-20 MED ORDER — ROCURONIUM BROMIDE 10 MG/ML (PF) SYRINGE
PREFILLED_SYRINGE | INTRAVENOUS | Status: AC
  Filled 2021-03-20: qty 10

## 2021-03-20 MED ORDER — CHLORHEXIDINE GLUCONATE 0.12 % MT SOLN
15.0000 mL | OROMUCOSAL | Status: AC
  Administered 2021-03-20: 15 mL via OROMUCOSAL
  Filled 2021-03-20 (×2): qty 15

## 2021-03-20 MED ORDER — MEPERIDINE HCL 25 MG/ML IJ SOLN
INTRAMUSCULAR | Status: AC
  Filled 2021-03-20: qty 1

## 2021-03-20 MED ORDER — FLEET ENEMA 7-19 GM/118ML RE ENEM
1.0000 | ENEMA | Freq: Once | RECTAL | Status: DC
  Filled 2021-03-20: qty 1

## 2021-03-20 MED ORDER — FENTANYL CITRATE (PF) 250 MCG/5ML IJ SOLN
INTRAMUSCULAR | Status: AC
  Filled 2021-03-20: qty 5

## 2021-03-20 MED ORDER — SODIUM CHLORIDE (PF) 0.9 % IJ SOLN
INTRAMUSCULAR | Status: AC
  Filled 2021-03-20: qty 20

## 2021-03-20 MED ORDER — KETOROLAC TROMETHAMINE 15 MG/ML IJ SOLN
INTRAMUSCULAR | Status: DC | PRN
  Administered 2021-03-20: 30 mg via INTRAVENOUS

## 2021-03-20 MED ORDER — BUPIVACAINE LIPOSOME 1.3 % IJ SUSP
INTRAMUSCULAR | Status: DC | PRN
  Administered 2021-03-20: 20 mL

## 2021-03-20 MED ORDER — PROMETHAZINE HCL 25 MG/ML IJ SOLN: 6.2500 mg | INTRAMUSCULAR | Status: DC | PRN

## 2021-03-20 MED ORDER — FENTANYL CITRATE (PF) 250 MCG/5ML IJ SOLN
INTRAMUSCULAR | Status: DC | PRN
  Administered 2021-03-20 (×3): 50 ug via INTRAVENOUS

## 2021-03-20 MED ORDER — ONDANSETRON HCL 4 MG/2ML IJ SOLN
INTRAMUSCULAR | Status: DC | PRN
Start: 2021-03-20 — End: 2021-03-20
  Administered 2021-03-20: 4 mg via INTRAVENOUS

## 2021-03-20 MED ORDER — DEXAMETHASONE SODIUM PHOSPHATE 10 MG/ML IJ SOLN
INTRAMUSCULAR | Status: DC | PRN
  Administered 2021-03-20: 10 mg via INTRAVENOUS

## 2021-03-20 MED ORDER — LIDOCAINE 2% (20 MG/ML) 5 ML SYRINGE
INTRAMUSCULAR | Status: DC | PRN
  Administered 2021-03-20: 80 mg via INTRAVENOUS

## 2021-03-20 MED ORDER — MEPERIDINE HCL 25 MG/ML IJ SOLN
6.2500 mg | INTRAMUSCULAR | Status: DC | PRN
  Administered 2021-03-20: 12.5 mg via INTRAVENOUS

## 2021-03-20 MED ORDER — MIDAZOLAM HCL 2 MG/2ML IJ SOLN
INTRAMUSCULAR | Status: DC | PRN
  Administered 2021-03-20: 2 mg via INTRAVENOUS

## 2021-03-20 MED ORDER — ONDANSETRON HCL 4 MG/2ML IJ SOLN
INTRAMUSCULAR | Status: AC
  Filled 2021-03-20: qty 2

## 2021-03-20 MED ORDER — HYDROMORPHONE HCL 1 MG/ML IJ SOLN: 0.2500 mg | INTRAMUSCULAR | Status: DC | PRN

## 2021-03-20 MED ORDER — GLYCOPYRROLATE PF 0.2 MG/ML IJ SOSY
PREFILLED_SYRINGE | INTRAMUSCULAR | Status: DC | PRN
  Administered 2021-03-20: .1 mg via INTRAVENOUS

## 2021-03-20 MED ORDER — GABAPENTIN 300 MG PO CAPS
300.0000 mg | ORAL_CAPSULE | ORAL | Status: AC
  Administered 2021-03-20: 300 mg via ORAL
  Filled 2021-03-20: qty 1

## 2021-03-20 MED ORDER — ACETAMINOPHEN 500 MG PO TABS
1000.0000 mg | ORAL_TABLET | ORAL | Status: AC
  Administered 2021-03-20: 1000 mg via ORAL
  Filled 2021-03-20: qty 2

## 2021-03-20 MED ORDER — DOCUSATE SODIUM 100 MG PO CAPS: 100.0000 mg | ORAL_CAPSULE | Freq: Two times a day (BID) | ORAL | 2 refills | Status: AC

## 2021-03-20 MED ORDER — BUPIVACAINE LIPOSOME 1.3 % IJ SUSP
INTRAMUSCULAR | Status: AC
  Filled 2021-03-20: qty 20

## 2021-03-20 MED ORDER — PROPOFOL 10 MG/ML IV BOLUS
INTRAVENOUS | Status: AC
  Filled 2021-03-20: qty 40

## 2021-03-20 SURGICAL SUPPLY — 24 items
BAG COUNTER SPONGE SURGICOUNT (BAG) ×2 IMPLANT
CANISTER SUCT 3000ML PPV (MISCELLANEOUS) ×2 IMPLANT
COVER SURGICAL LIGHT HANDLE (MISCELLANEOUS) ×2 IMPLANT
DRAPE LAPAROTOMY 100X72 PEDS (DRAPES) ×2 IMPLANT
DRAPE LAPAROTOMY T 98X78 PEDS (DRAPES) ×2 IMPLANT
ELECT REM PT RETURN 9FT ADLT (ELECTROSURGICAL) ×2
ELECTRODE REM PT RTRN 9FT ADLT (ELECTROSURGICAL) ×1 IMPLANT
GAUZE 4X4 16PLY ~~LOC~~+RFID DBL (SPONGE) ×2 IMPLANT
GAUZE SPONGE 4X4 12PLY STRL (GAUZE/BANDAGES/DRESSINGS) ×2 IMPLANT
GOWN STRL REUS W/ TWL LRG LVL3 (GOWN DISPOSABLE) ×2 IMPLANT
GOWN STRL REUS W/TWL LRG LVL3 (GOWN DISPOSABLE) ×4
KIT BASIN OR (CUSTOM PROCEDURE TRAY) ×2 IMPLANT
KIT TURNOVER KIT B (KITS) ×2 IMPLANT
NEEDLE 22X1 1/2 (OR ONLY) (NEEDLE) ×2 IMPLANT
NS IRRIG 1000ML POUR BTL (IV SOLUTION) ×2 IMPLANT
PACK GENERAL/GYN (CUSTOM PROCEDURE TRAY) ×2 IMPLANT
PAD ARMBOARD 7.5X6 YLW CONV (MISCELLANEOUS) ×4 IMPLANT
PENCIL SMOKE EVACUATOR (MISCELLANEOUS) ×2 IMPLANT
SPECIMEN JAR SMALL (MISCELLANEOUS) ×2 IMPLANT
SPONGE SURGIFOAM ABS GEL 100 (HEMOSTASIS) IMPLANT
SURGILUBE 2OZ TUBE FLIPTOP (MISCELLANEOUS) ×2 IMPLANT
SUT CHROMIC 2 0 SH (SUTURE) ×4 IMPLANT
SYR CONTROL 10ML LL (SYRINGE) ×2 IMPLANT
TOWEL GREEN STERILE FF (TOWEL DISPOSABLE) ×4 IMPLANT

## 2021-03-20 NOTE — Anesthesia Procedure Notes (Signed)
Procedure Name: Intubation Date/Time: 03/20/2021 8:31 AM Performed by: Stanton Kidney, CRNA Pre-anesthesia Checklist: Patient identified, Emergency Drugs available, Suction available and Patient being monitored Patient Re-evaluated:Patient Re-evaluated prior to induction Oxygen Delivery Method: Circle system utilized Preoxygenation: Pre-oxygenation with 100% oxygen Induction Type: IV induction Ventilation: Mask ventilation without difficulty Laryngoscope Size: Miller and 3 Grade View: Grade I Tube type: Oral Tube size: 7.5 mm Number of attempts: 1 Airway Equipment and Method: Stylet and Oral airway Placement Confirmation: ETT inserted through vocal cords under direct vision, positive ETCO2 and breath sounds checked- equal and bilateral Tube secured with: Tape Dental Injury: Teeth and Oropharynx as per pre-operative assessment

## 2021-03-20 NOTE — Transfer of Care (Signed)
Immediate Anesthesia Transfer of Care Note  Patient: Jeremy Copeland  Procedure(s) Performed: RECTAL EXAM UNDER ANESTHESIA HEMORRHOIDECTOMY PROLAPSED  Patient Location: PACU  Anesthesia Type:General  Level of Consciousness: drowsy and patient cooperative  Airway & Oxygen Therapy: Patient Spontanous Breathing and Patient connected to face mask oxygen  Post-op Assessment: Report given to RN and Post -op Vital signs reviewed and stable  Post vital signs: Reviewed and stable  Last Vitals:  Vitals Value Taken Time  BP 124/54 03/20/21 0906  Temp    Pulse 55 03/20/21 0907  Resp 11 03/20/21 0906  SpO2 100 % 03/20/21 0907  Vitals shown include unvalidated device data.  Last Pain:  Vitals:   03/20/21 2440  TempSrc:   PainSc: 0-No pain      Patients Stated Pain Goal: 0 (03/20/21 1027)  Complications: No notable events documented.

## 2021-03-20 NOTE — Discharge Instructions (Addendum)
[facility] DISCHARGE INSTRUCTIONS  Activity You may resume activities as tolerated. Do not drive while taking narcotic pain medication.  Wound Care You may feel some sutures on the skin around the anus - these will dissolve and do not need to be removed. You may have some drainage and small amounts of bleeding after surgery. If you have persistent excessive bleeding, please call the office immediately or come to the emergency room. There is packing in your rectum that will fall out when you have your first bowel movement after surgery - this should not be replaced. You should perform sitz baths 3 times daily and after bowel movements - sit in several inches of warm water for 15-20 minutes to keep the area clean.  Medications Take the stool softeners and fiber as prescribed. It is very important to avoid constipation and straining with bowel movements. Drink plenty of water every day. Take the pain medication as prescribed. It is normal to have significant pain after hemorrhoidectomy.  When to Call us: Fever greater than 100.5 Persistent bleeding per rectum Difficulty urinating  Follow-up You have an appointment scheduled with Dr. Freida Busman on April 10, 2021 at 10:30am. This will be at the [facility] office at [address], [address], [place], [place]. Please arrive at least 15 minutes prior to your scheduled appointment time.  For questions or concerns, please call the office at [phone].

## 2021-03-20 NOTE — Op Note (Signed)
Date: 03/20/21  Patient: Jeremy Copeland MRN: [id number]  Preoperative Diagnosis: Prolapsed internal hemorrhoid Postoperative Diagnosis: Perianal mass  Procedure: Rectal exam under anesthesia, excision of perianal mass  Surgeon: Sophronia Simas, MD  EBL: Minimal  Anesthesia: General endotracheal  Specimens: Perianal mass  Indications: Mr. Longest is a 20 yo male who presented with pain and discomfort from a perianal mass, which on initial exam appeared to be a hemorrhoid. He was brought to the operating for exam under anesthesia and excision.  Findings: 3cm pedunculated mass at the left inferolateral anal verge, possibly an anal condyloma. No internal hemorrhoids or masses.  Procedure details: Informed consent was obtained in the preoperative area prior to the procedure. The patient was brought to the operating room and general anesthesia was induced. The patietn was placed in the prone position, and prepped and draped in the usual sterile fashion. A pre-procedure timeout was taken verifying patient identity, surgical site and procedure to be performed.  There was an ulcerated mass at the anal verge, in the inferolateral position. It was approximately 3cm in diameter and was on a small stalk. No external skin tags or other external abnormalities were noted. A digital rectal exam was performed and no masses were palpated. A Hill-Ferguson retractor was placed in the rectum and the anal canal was examined. No enlarged or inflamed internal hemorrhoids were present, and there were no masses. The retractor was removed. The pedunculated mass was excised sharply and sent for routine pathology. Hemostasis was achieved with cautery and the resulting small skin defect was closed with interrupted 3-0 chromic suture. A field block was placed with Exparel. A clean gauze dressing was applied.  The patient tolerated the procedure well with no apparent complications. All counts were correct x2 at the end of the  procedure. The patient was extubated and taken to PACU in stable condition.  Sophronia Simas, MD 03/20/21 9:10 AM

## 2021-03-20 NOTE — Interval H&P Note (Signed)
History and Physical Interval Note:  03/20/2021 7:41 AM  Jeremy Copeland  has presented today for surgery, with the diagnosis of HEMORRHOID.  The various methods of treatment have been discussed with the patient and family. After consideration of risks, benefits and other options for treatment, the patient has consented to  Procedure(s): RECTAL EXAM UNDER ANESTHESIA (N/A) HEMORRHOIDECTOMY PROLAPSED (N/A) as a surgical intervention.  The patient's history has been reviewed, patient examined, no change in status, stable for surgery.  I have reviewed the patient's chart and labs.  Questions were answered to the patient's satisfaction.     Fritzi Mandes

## 2021-03-20 NOTE — Anesthesia Postprocedure Evaluation (Signed)
Anesthesia Post Note  Patient: Jeremy Copeland  Procedure(s) Performed: RECTAL EXAM UNDER ANESTHESIA HEMORRHOIDECTOMY PROLAPSED     Patient location during evaluation: PACU Anesthesia Type: General Level of consciousness: awake and alert, oriented and patient cooperative Pain management: pain level controlled Vital Signs Assessment: post-procedure vital signs reviewed and stable Respiratory status: spontaneous breathing, nonlabored ventilation and respiratory function stable Cardiovascular status: blood pressure returned to baseline and stable Postop Assessment: no apparent nausea or vomiting Anesthetic complications: no   No notable events documented.  Last Vitals:  Vitals:   03/20/21 1041 03/20/21 1100  BP: 135/78 132/81  Pulse: (!) 51 87  Resp: 12 11  Temp:    SpO2: 98% 99%    Last Pain:  Vitals:   03/20/21 1030  TempSrc:   PainSc: 0-No pain                 Lannie Fields

## 2021-03-21 ENCOUNTER — Encounter (HOSPITAL_COMMUNITY): Payer: Self-pay | Admitting: Surgery

## 2021-03-21 LAB — SURGICAL PATHOLOGY

## 2021-05-10 ENCOUNTER — Other Ambulatory Visit: Payer: Self-pay

## 2021-05-10 ENCOUNTER — Encounter (HOSPITAL_COMMUNITY): Payer: Self-pay | Admitting: Emergency Medicine

## 2021-05-10 ENCOUNTER — Ambulatory Visit (HOSPITAL_COMMUNITY)
Admission: EM | Admit: 2021-05-10 | Discharge: 2021-05-10 | Disposition: A | Discharge status: Home / Self Care | Payer: Medicaid Other | Attending: Emergency Medicine | Admitting: Emergency Medicine

## 2021-05-10 DIAGNOSIS — S39012A Strain of muscle, fascia and tendon of lower back, initial encounter: Secondary | ICD-10-CM

## 2021-05-10 DIAGNOSIS — J4520 Mild intermittent asthma, uncomplicated: Secondary | ICD-10-CM

## 2021-05-10 MED ORDER — ETODOLAC 400 MG PO TABS: 400.0000 mg | ORAL_TABLET | Freq: Two times a day (BID) | ORAL | 3 refills | Status: DC

## 2021-05-10 MED ORDER — TIZANIDINE HCL 4 MG PO TABS: 4.0000 mg | ORAL_TABLET | Freq: Four times a day (QID) | ORAL | 0 refills | Status: DC | PRN

## 2021-05-10 MED ORDER — ALBUTEROL SULFATE HFA 108 (90 BASE) MCG/ACT IN AERS: 2.0000 | INHALATION_SPRAY | RESPIRATORY_TRACT | 0 refills | Status: DC | PRN

## 2021-05-10 NOTE — ED Provider Notes (Signed)
[facility]    CSN: [id number] Arrival date & time: 05/10/21  1309      History   Chief Complaint Chief Complaint  Patient presents with   Back Pain    HPI Jeremy Copeland is a 20 y.o. male.   Patient here for evaluation of lower back pain that has been ongoing for the past several months.  Patient reports pain started after having hemorrhoid surgery at the end of July.  Denies any complications with the surgery.  Denies any bowel or bladder problems.  Reports pain is a constant pulling sensation and worse with movement.  Reports pain does improve slightly when laying flat.  Also reports history of asthma and working in a dusty building.  Reports intermittent wheezing and shortness of breath at work.  States that he does not currently have an inhaler.  Denies any trauma, injury, or other precipitating event.  Denies any fevers, chest pain, shortness of breath, N/V/D, numbness, tingling, weakness, abdominal pain, or headaches.    The history is provided by the patient.  Back Pain  Past Medical History:  Diagnosis Date   Asthma    no inhaler   Depression     Patient Active Problem List   Diagnosis Date Noted   Asthma, not well controlled 07/17/2020   Chronic rhinitis 07/17/2020    Past Surgical History:  Procedure Laterality Date   HEMORRHOID SURGERY N/A 03/20/2021   Procedure: HEMORRHOIDECTOMY PROLAPSED;  Surgeon: Fritzi Mandes, MD;  Location: [facility] OR;  Service: General;  Laterality: N/A;   NO PAST SURGERIES     RECTAL EXAM UNDER ANESTHESIA N/A 03/20/2021   Procedure: RECTAL EXAM UNDER ANESTHESIA;  Surgeon: Fritzi Mandes, MD;  Location: [facility] OR;  Service: General;  Laterality: N/A;       Home Medications    Prior to Admission medications   Medication Sig Start Date End Date Taking? Authorizing Provider  etodolac (LODINE) 400 MG tablet Take 1 tablet (400 mg total) by mouth 2 (two) times daily. 05/10/21  Yes Ivette Loyal, NP  tiZANidine (ZANAFLEX) 4 MG  tablet Take 1 tablet (4 mg total) by mouth every 6 (six) hours as needed for muscle spasms. 05/10/21  Yes Ivette Loyal, NP  albuterol (VENTOLIN HFA) 108 (90 Base) MCG/ACT inhaler Inhale 2 puffs into the lungs every 4 (four) hours as needed for wheezing or shortness of breath. 05/10/21   Ivette Loyal, NP  budesonide-formoterol [facility]) 160-4.5 MCG/ACT inhaler Inhale 2 puffs into the lungs in the morning and at bedtime. with spacer and rinse mouth afterwards. Patient not taking: No sig reported 08/14/20   Ellamae Sia, DO  cetirizine (ZYRTEC ALLERGY) 10 MG tablet Take 1 tablet (10 mg total) by mouth daily. Patient not taking: Reported on 02/22/2021 06/24/20   Moshe Cipro, NP  dicyclomine (BENTYL) 20 MG tablet Take 1 tablet (20 mg total) by mouth 4 (four) times daily -  before meals and at bedtime. Patient not taking: No sig reported 05/12/20   Wieters, Hallie C, PA-C  fluticasone (FLONASE) 50 MCG/ACT nasal spray Place 1-2 sprays into both nostrils daily for 7 days. Patient not taking: Reported on 02/22/2021 05/12/20 05/19/20  Wieters, Junius Creamer, PA-C  HYDROcodone-acetaminophen (NORCO/VICODIN) 5-325 MG tablet Take 1 tablet by mouth every 6 (six) hours as needed for severe pain. 03/20/21   Fritzi Mandes, MD  montelukast (SINGULAIR) 10 MG tablet Take 1 tablet (10 mg total) by mouth at bedtime. Patient not taking: Reported on  02/22/2021 07/17/20   Ellamae Sia, DO  ondansetron (ZOFRAN ODT) 4 MG disintegrating tablet Take 1 tablet (4 mg total) by mouth every 8 (eight) hours as needed for nausea or vomiting. Patient not taking: Reported on 02/22/2021 02/01/21   Rushie Chestnut, PA-C    Family History Family History  Problem Relation Age of Onset   Healthy Mother    Healthy Father    Heart disease Maternal Grandfather    Heart disease Maternal Uncle    Heart disease Maternal Uncle    Heart disease Cousin     Social History Social History   Tobacco Use   Smoking status: Never   Smokeless  tobacco: Never  Vaping Use   Vaping Use: Former   Quit date: 08/24/2020  Substance Use Topics   Alcohol use: Not Currently    Comment: none recently, reports occasionally will drink 16-18 shots in one night   Drug use: Never     Allergies   Patient has no known allergies.   Review of Systems Review of Systems  Musculoskeletal:  Positive for back pain.  All other systems reviewed and are negative.   Physical Exam Triage Vital Signs ED Triage Vitals  Enc Vitals Group     BP 05/10/21 1443 128/83     Pulse Rate 05/10/21 1443 73     Resp 05/10/21 1443 16     Temp 05/10/21 1443 99 F (37.2 C)     Temp Source 05/10/21 1443 Oral     SpO2 05/10/21 1443 98 %     Weight --      Height --      Head Circumference --      Peak Flow --      Pain Score 05/10/21 1440 9     Pain Loc --      Pain Edu? --      Excl. in GC? --    No data found.  Updated Vital Signs BP 128/83 (BP Location: Right Arm)   Pulse 73   Temp 99 F (37.2 C) (Oral)   Resp 16   SpO2 98%   Visual Acuity Right Eye Distance:   Left Eye Distance:   Bilateral Distance:    Right Eye Near:   Left Eye Near:    Bilateral Near:     Physical Exam Vitals and nursing note reviewed.  Constitutional:      General: He is not in acute distress.    Appearance: Normal appearance. He is not ill-appearing, toxic-appearing or diaphoretic.  HENT:     Head: Normocephalic and atraumatic.  Eyes:     Conjunctiva/sclera: Conjunctivae normal.  Cardiovascular:     Rate and Rhythm: Normal rate.     Pulses: Normal pulses.  Pulmonary:     Effort: Pulmonary effort is normal.     Breath sounds: Normal breath sounds.  Abdominal:     General: Abdomen is flat.  Musculoskeletal:        General: Normal range of motion.     Cervical back: Normal range of motion.     Lumbar back: Spasms and tenderness present. No deformity, lacerations or bony tenderness. Normal range of motion. Negative right straight leg raise test and  negative left straight leg raise test.       Back:  Skin:    General: Skin is warm and dry.  Neurological:     General: No focal deficit present.     Mental Status: He is alert and oriented to  person, place, and time.  Psychiatric:        Mood and Affect: Mood normal.     UC Treatments / Results  Labs (all labs ordered are listed, but only abnormal results are displayed) Labs Reviewed - No data to display  EKG   Radiology No results found.  Procedures Procedures (including critical care time)  Medications Ordered in UC Medications - No data to display  Initial Impression / Assessment and Plan / UC Course  I have reviewed the triage vital signs and the nursing notes.  Pertinent labs & imaging results that were available during my care of the patient were reviewed by me and considered in my medical decision making (see chart for details).    Assessment negative for red flags or concerns.  This is likely lumbar strain.  Will prescribe Lodine and Zanaflex as needed for pain and muscle spasms.  Instructed not to take Zanaflex prior to driving as it can cause drowsiness.  May also take Tylenol as needed.  Recommend heat, ice, or alternate between heat and ice for comfort.  May use OTC pain relief patches or creams as needed.  Follow-up with orthopedics or primary care if pain does not improve. Mild intermittent asthma without complication.  Refill sent for albuterol inhaler.  May use as needed for shortness of breath.  Follow-up with primary care for reevaluation. Final Clinical Impressions(s) / UC Diagnoses   Final diagnoses:  Strain of lumbar region, initial encounter  Mild intermittent asthma without complication     Discharge Instructions      You can take the Lodine twice a day as needed for pain. You can also take Tylenol as needed for pain relief.  You can take the Zanaflex every 6 hours as needed for muscle pain and spasms.  Do not take this before driving as it  can make you sleepy.  You can use heat, ice, or alternate between heat and ice for comfort.  You may try IcyHot, lidocaine patches, Biofreeze, Aspercreme, or Voltaren gel as needed for pain relief.  Follow-up with your primary care or orthopedics if your back pain does not resolve in the next few weeks.     ED Prescriptions     Medication Sig Dispense Auth. Provider   albuterol (VENTOLIN HFA) 108 (90 Base) MCG/ACT inhaler Inhale 2 puffs into the lungs every 4 (four) hours as needed for wheezing or shortness of breath. 18 g Ivette Loyal, NP   etodolac (LODINE) 400 MG tablet Take 1 tablet (400 mg total) by mouth 2 (two) times daily. 30 tablet Ivette Loyal, NP   tiZANidine (ZANAFLEX) 4 MG tablet Take 1 tablet (4 mg total) by mouth every 6 (six) hours as needed for muscle spasms. 30 tablet Ivette Loyal, NP      PDMP not reviewed this encounter.   Ivette Loyal, NP 05/10/21 [phone]

## 2021-05-10 NOTE — ED Triage Notes (Signed)
Pt reports having lower back pains since surgery for hemorrhoids on July 27. Denies problems with bowels. Or urination. Reports just went back to work after being out entire month of August and pains are worse at work.

## 2021-05-10 NOTE — Discharge Instructions (Signed)
You can take the Lodine twice a day as needed for pain. You can also take Tylenol as needed for pain relief.  You can take the Zanaflex every 6 hours as needed for muscle pain and spasms.  Do not take this before driving as it can make you sleepy.  You can use heat, ice, or alternate between heat and ice for comfort.  You may try IcyHot, lidocaine patches, Biofreeze, Aspercreme, or Voltaren gel as needed for pain relief.  Follow-up with your primary care or orthopedics if your back pain does not resolve in the next few weeks.

## 2021-07-05 ENCOUNTER — Encounter (HOSPITAL_COMMUNITY): Payer: Self-pay

## 2021-07-05 ENCOUNTER — Other Ambulatory Visit: Payer: Self-pay

## 2021-07-05 ENCOUNTER — Ambulatory Visit (HOSPITAL_COMMUNITY)
Admission: EM | Admit: 2021-07-05 | Discharge: 2021-07-05 | Disposition: A | Discharge status: Home / Self Care | Payer: Medicaid Other | Attending: Urgent Care | Admitting: Urgent Care

## 2021-07-05 ENCOUNTER — Ambulatory Visit (INDEPENDENT_AMBULATORY_CARE_PROVIDER_SITE_OTHER): Payer: Medicaid Other

## 2021-07-05 DIAGNOSIS — R059 Cough, unspecified: Secondary | ICD-10-CM

## 2021-07-05 DIAGNOSIS — J4540 Moderate persistent asthma, uncomplicated: Secondary | ICD-10-CM | POA: Diagnosis not present

## 2021-07-05 DIAGNOSIS — J4541 Moderate persistent asthma with (acute) exacerbation: Secondary | ICD-10-CM

## 2021-07-05 DIAGNOSIS — R112 Nausea with vomiting, unspecified: Secondary | ICD-10-CM

## 2021-07-05 DIAGNOSIS — R109 Unspecified abdominal pain: Secondary | ICD-10-CM

## 2021-07-05 DIAGNOSIS — J3089 Other allergic rhinitis: Secondary | ICD-10-CM | POA: Diagnosis not present

## 2021-07-05 DIAGNOSIS — R051 Acute cough: Secondary | ICD-10-CM

## 2021-07-05 MED ORDER — PREDNISONE 20 MG PO TABS: ORAL_TABLET | ORAL | 0 refills | Status: DC

## 2021-07-05 MED ORDER — ONDANSETRON 8 MG PO TBDP: 8.0000 mg | ORAL_TABLET | Freq: Three times a day (TID) | ORAL | 0 refills | Status: DC | PRN

## 2021-07-05 MED ORDER — ALBUTEROL SULFATE HFA 108 (90 BASE) MCG/ACT IN AERS: 2.0000 | INHALATION_SPRAY | RESPIRATORY_TRACT | 0 refills | Status: DC | PRN

## 2021-07-05 MED ORDER — PROMETHAZINE-DM 6.25-15 MG/5ML PO SYRP: 5.0000 mL | ORAL_SOLUTION | Freq: Every evening | ORAL | 0 refills | Status: DC | PRN

## 2021-07-05 MED ORDER — MONTELUKAST SODIUM 10 MG PO TABS: 10.0000 mg | ORAL_TABLET | Freq: Every day | ORAL | 0 refills | Status: DC

## 2021-07-05 MED ORDER — CETIRIZINE HCL 10 MG PO TABS: 10.0000 mg | ORAL_TABLET | Freq: Every day | ORAL | 0 refills | Status: DC

## 2021-07-05 NOTE — ED Triage Notes (Signed)
Pt presents with abdominal pain and vomiting x 1 week. States he has a sore throat. Pt states he has been coughing a lot.

## 2021-07-05 NOTE — ED Provider Notes (Signed)
[facility]   MRN: [id number] DOB: Nov 11, 2000  Subjective:   Jeremy Copeland is a 20 y.o. male presenting for 1 week history of persistent cough, coughing fits that elicits vomiting and belly pain.  He is also had throat congestion, sensation that he needs to continue to spit.  Has persistent sinus congestion.  Does not have any of his medications for allergic/chronic rhinitis for his asthma.  No chest pain, shortness of breath or wheezing.  Does not want testing for respiratory illness.  No smoking cigarettes or marijuana.  Denies history of GI disorders.  No current facility-administered medications for this encounter.  Current Outpatient Medications:    albuterol (VENTOLIN HFA) 108 (90 Base) MCG/ACT inhaler, Inhale 2 puffs into the lungs every 4 (four) hours as needed for wheezing or shortness of breath., Disp: 18 g, Rfl: 0   budesonide-formoterol (SYMBICORT) 160-4.5 MCG/ACT inhaler, Inhale 2 puffs into the lungs in the morning and at bedtime. with spacer and rinse mouth afterwards. (Patient not taking: No sig reported), Disp: 1 each, Rfl: 5   cetirizine (ZYRTEC ALLERGY) 10 MG tablet, Take 1 tablet (10 mg total) by mouth daily. (Patient not taking: Reported on 02/22/2021), Disp: 30 tablet, Rfl: 0   dicyclomine (BENTYL) 20 MG tablet, Take 1 tablet (20 mg total) by mouth 4 (four) times daily -  before meals and at bedtime. (Patient not taking: No sig reported), Disp: 20 tablet, Rfl: 0   etodolac (LODINE) 400 MG tablet, Take 1 tablet (400 mg total) by mouth 2 (two) times daily., Disp: 30 tablet, Rfl: 3   fluticasone (FLONASE) 50 MCG/ACT nasal spray, Place 1-2 sprays into both nostrils daily for 7 days. (Patient not taking: Reported on 02/22/2021), Disp: 1 g, Rfl: 0   HYDROcodone-acetaminophen (NORCO/VICODIN) 5-325 MG tablet, Take 1 tablet by mouth every 6 (six) hours as needed for severe pain., Disp: 15 tablet, Rfl: 0   montelukast (SINGULAIR) 10 MG tablet, Take 1 tablet (10 mg  total) by mouth at bedtime. (Patient not taking: Reported on 02/22/2021), Disp: 30 tablet, Rfl: 5   ondansetron (ZOFRAN ODT) 4 MG disintegrating tablet, Take 1 tablet (4 mg total) by mouth every 8 (eight) hours as needed for nausea or vomiting. (Patient not taking: Reported on 02/22/2021), Disp: 20 tablet, Rfl: 0   tiZANidine (ZANAFLEX) 4 MG tablet, Take 1 tablet (4 mg total) by mouth every 6 (six) hours as needed for muscle spasms., Disp: 30 tablet, Rfl: 0   No Known Allergies  Past Medical History:  Diagnosis Date   Asthma    no inhaler   Depression      Past Surgical History:  Procedure Laterality Date   HEMORRHOID SURGERY N/A 03/20/2021   Procedure: HEMORRHOIDECTOMY PROLAPSED;  Surgeon: Fritzi Mandes, MD;  Location: [facility] OR;  Service: General;  Laterality: N/A;   NO PAST SURGERIES     RECTAL EXAM UNDER ANESTHESIA N/A 03/20/2021   Procedure: RECTAL EXAM UNDER ANESTHESIA;  Surgeon: Fritzi Mandes, MD;  Location: [facility] OR;  Service: General;  Laterality: N/A;    Family History  Problem Relation Age of Onset   Healthy Mother    Healthy Father    Heart disease Maternal Grandfather    Heart disease Maternal Uncle    Heart disease Maternal Uncle    Heart disease Cousin     Social History   Tobacco Use   Smoking status: Never   Smokeless tobacco: Never  Vaping Use   Vaping Use: Former  Quit date: 08/24/2020  Substance Use Topics   Alcohol use: Not Currently    Comment: none recently, reports occasionally will drink 16-18 shots in one night   Drug use: Never    ROS   Objective:   Vitals: BP 128/87 (BP Location: Right Arm)   Pulse 94   Temp 98.3 F (36.8 C) (Oral)   Resp 18   SpO2 97%   Physical Exam Constitutional:      General: He is not in acute distress.    Appearance: Normal appearance. He is well-developed and normal weight. He is not ill-appearing, toxic-appearing or diaphoretic.  HENT:     Head: Normocephalic and atraumatic.     Right Ear: Tympanic  membrane, ear canal and external ear normal. There is no impacted cerumen.     Left Ear: Tympanic membrane, ear canal and external ear normal. There is no impacted cerumen.     Nose: Nose normal. No congestion or rhinorrhea.     Mouth/Throat:     Mouth: Mucous membranes are moist.     Pharynx: No oropharyngeal exudate or posterior oropharyngeal erythema.  Eyes:     General: No scleral icterus.       Right eye: No discharge.        Left eye: No discharge.     Extraocular Movements: Extraocular movements intact.     Conjunctiva/sclera: Conjunctivae normal.     Pupils: Pupils are equal, round, and reactive to light.  Cardiovascular:     Rate and Rhythm: Normal rate and regular rhythm.     Heart sounds: Normal heart sounds. No murmur heard.   No friction rub. No gallop.  Pulmonary:     Effort: Pulmonary effort is normal. No respiratory distress.     Breath sounds: No stridor. No wheezing, rhonchi or rales.  Abdominal:     General: Bowel sounds are normal. There is no distension.     Palpations: Abdomen is soft. There is no mass.     Tenderness: There is no abdominal tenderness. There is no right CVA tenderness, left CVA tenderness, guarding or rebound.  Musculoskeletal:     Cervical back: Normal range of motion and neck supple. No rigidity. No muscular tenderness.  Neurological:     General: No focal deficit present.     Mental Status: He is alert and oriented to person, place, and time.  Psychiatric:        Mood and Affect: Mood normal.        Behavior: Behavior normal.        Thought Content: Thought content normal.        Judgment: Judgment normal.    DG Chest 2 View  Result Date: 07/05/2021 CLINICAL DATA:  Cough history of asthma EXAM: CHEST - 2 VIEW COMPARISON:  11/01/2020 FINDINGS: The heart size and mediastinal contours are within normal limits. Both lungs are clear. The visualized skeletal structures are unremarkable. IMPRESSION: No active cardiopulmonary disease.  Electronically Signed   By: Jasmine Pang M.D.   On: 07/05/2021 17:45     Assessment and Plan :   PDMP not reviewed this encounter.  1. Moderate persistent asthma without complication   2. Acute cough   3. Nausea and vomiting, unspecified vomiting type   4. Right sided abdominal pain   5. Moderate persistent asthma with acute exacerbation   6. Allergic rhinitis due to other allergic trigger, unspecified seasonality     Will use an oral prednisone course to address his asthma and allergic  rhinitis as the primary source of his symptoms as he reports that he regularly has trouble like this around this time of year.  Recommended restarting Zyrtec, Singulair long-term.  Refilled his albuterol inhaler.  Follow-up with a GI specialist if the GI symptoms persist.  Otherwise use supportive care including Zofran.  Counseled patient on potential for adverse effects with medications prescribed/recommended today, ER and return-to-clinic precautions discussed, patient verbalized understanding.    Wallis Bamberg, PA-C 07/05/21 1758

## 2021-08-25 ENCOUNTER — Encounter (HOSPITAL_COMMUNITY): Payer: Self-pay | Admitting: Emergency Medicine

## 2021-08-25 ENCOUNTER — Ambulatory Visit: Admission: EM | Admit: 2021-08-25 | Discharge: 2021-08-25 | Payer: Medicaid Other

## 2021-08-25 ENCOUNTER — Emergency Department (HOSPITAL_COMMUNITY)
Admission: EM | Admit: 2021-08-25 | Discharge: 2021-08-25 | Disposition: A | Discharge status: Home / Self Care | Payer: Medicaid Other | Attending: Emergency Medicine | Admitting: Emergency Medicine

## 2021-08-25 ENCOUNTER — Other Ambulatory Visit: Payer: Self-pay

## 2021-08-25 DIAGNOSIS — R45851 Suicidal ideations: Secondary | ICD-10-CM | POA: Diagnosis not present

## 2021-08-25 DIAGNOSIS — J069 Acute upper respiratory infection, unspecified: Secondary | ICD-10-CM | POA: Diagnosis not present

## 2021-08-25 DIAGNOSIS — F1290 Cannabis use, unspecified, uncomplicated: Secondary | ICD-10-CM | POA: Diagnosis not present

## 2021-08-25 DIAGNOSIS — S01402A Unspecified open wound of left cheek and temporomandibular area, initial encounter: Secondary | ICD-10-CM

## 2021-08-25 DIAGNOSIS — J45909 Unspecified asthma, uncomplicated: Secondary | ICD-10-CM | POA: Diagnosis not present

## 2021-08-25 DIAGNOSIS — Z20822 Contact with and (suspected) exposure to covid-19: Secondary | ICD-10-CM | POA: Insufficient documentation

## 2021-08-25 DIAGNOSIS — F321 Major depressive disorder, single episode, moderate: Secondary | ICD-10-CM | POA: Diagnosis not present

## 2021-08-25 DIAGNOSIS — R051 Acute cough: Secondary | ICD-10-CM | POA: Diagnosis not present

## 2021-08-25 DIAGNOSIS — R7309 Other abnormal glucose: Secondary | ICD-10-CM | POA: Diagnosis not present

## 2021-08-25 DIAGNOSIS — J45901 Unspecified asthma with (acute) exacerbation: Secondary | ICD-10-CM

## 2021-08-25 LAB — CBC WITH DIFFERENTIAL/PLATELET
Abs Immature Granulocytes: 0.01 10*3/uL (ref 0.00–0.07)
Basophils Absolute: 0 10*3/uL (ref 0.0–0.1)
Basophils Relative: 0 %
Eosinophils Absolute: 0 10*3/uL (ref 0.0–0.5)
Eosinophils Relative: 0 %
HCT: 44.3 % (ref 39.0–52.0)
Hemoglobin: 15 g/dL (ref 13.0–17.0)
Immature Granulocytes: 0 %
Lymphocytes Relative: 20 %
Lymphs Abs: 1.1 10*3/uL (ref 0.7–4.0)
MCH: 29.2 pg (ref 26.0–34.0)
MCHC: 33.9 g/dL (ref 30.0–36.0)
MCV: 86.4 fL (ref 80.0–100.0)
Monocytes Absolute: 0.7 10*3/uL (ref 0.1–1.0)
Monocytes Relative: 13 %
Neutro Abs: 3.7 10*3/uL (ref 1.7–7.7)
Neutrophils Relative %: 67 %
Platelets: 229 10*3/uL (ref 150–400)
RBC: 5.13 MIL/uL (ref 4.22–5.81)
RDW: 12.4 % (ref 11.5–15.5)
WBC: 5.5 10*3/uL (ref 4.0–10.5)
nRBC: 0 % (ref 0.0–0.2)

## 2021-08-25 LAB — COMPREHENSIVE METABOLIC PANEL
ALT: 18 U/L (ref 0–44)
AST: 24 U/L (ref 15–41)
Albumin: 4.2 g/dL (ref 3.5–5.0)
Alkaline Phosphatase: 53 U/L (ref 38–126)
Anion gap: 10 (ref 5–15)
BUN: 18 mg/dL (ref 6–20)
CO2: 25 mmol/L (ref 22–32)
Calcium: 9.2 mg/dL (ref 8.9–10.3)
Chloride: 101 mmol/L (ref 98–111)
Creatinine, Ser: 0.83 mg/dL (ref 0.61–1.24)
GFR, Estimated: >60 mL/min (ref >60)
Glucose, Bld: 69 mg/dL — ABNORMAL LOW (ref 70–99)
Potassium: 3.6 mmol/L (ref 3.5–5.1)
Sodium: 136 mmol/L (ref 135–145)
Total Bilirubin: 0.8 mg/dL (ref 0.3–1.2)
Total Protein: 7.9 g/dL (ref 6.5–8.1)

## 2021-08-25 LAB — CBG MONITORING, ED: Glucose-Capillary: 75 mg/dL (ref 70–99)

## 2021-08-25 LAB — RESP PANEL BY RT-PCR (FLU A&B, COVID) ARPGX2
Influenza A by PCR: NEGATIVE
Influenza B by PCR: NEGATIVE
SARS Coronavirus 2 by RT PCR: NEGATIVE

## 2021-08-25 LAB — ETHANOL: Alcohol, Ethyl (B): <10 mg/dL (ref <10)

## 2021-08-25 MED ORDER — ALBUTEROL SULFATE HFA 108 (90 BASE) MCG/ACT IN AERS
2.0000 | INHALATION_SPRAY | Freq: Once | RESPIRATORY_TRACT | Status: AC
  Administered 2021-08-25: 2 via RESPIRATORY_TRACT
  Filled 2021-08-25: qty 6.7

## 2021-08-25 MED ORDER — LIDOCAINE VISCOUS HCL 2 % MT SOLN
15.0000 mL | Freq: Four times a day (QID) | OROMUCOSAL | Status: DC | PRN
  Filled 2021-08-25: qty 15

## 2021-08-25 MED ORDER — CHLORHEXIDINE GLUCONATE 0.12 % MT SOLN: 15.0000 mL | Freq: Two times a day (BID) | OROMUCOSAL | Status: DC

## 2021-08-25 MED ORDER — LIDOCAINE VISCOUS HCL 2 % MT SOLN: 15.0000 mL | Freq: Four times a day (QID) | OROMUCOSAL | 0 refills | Status: DC | PRN

## 2021-08-25 MED ORDER — ACETAMINOPHEN 500 MG PO TABS
1000.0000 mg | ORAL_TABLET | Freq: Once | ORAL | Status: AC
  Administered 2021-08-25: 1000 mg via ORAL
  Filled 2021-08-25: qty 2

## 2021-08-25 MED ORDER — PREDNISONE 20 MG PO TABS: ORAL_TABLET | ORAL | 0 refills | Status: DC

## 2021-08-25 MED ORDER — DEXAMETHASONE 4 MG PO TABS
6.0000 mg | ORAL_TABLET | Freq: Once | ORAL | Status: AC
  Administered 2021-08-25: 6 mg via ORAL
  Filled 2021-08-25: qty 1

## 2021-08-25 NOTE — ED Notes (Signed)
Patient changed into scrubs and wanded by security. One labeled patient belongings bag secured at triage nurse's station.  

## 2021-08-25 NOTE — ED Notes (Addendum)
°   08/25/21 1133  [place] Suicide Severity Rating Scale  1. Wish to be Dead Yes  2. Suicidal Thoughts Yes  3. Suicidal Thoughts with Method Without Specific Plan or Intent to Act Yes  4. Suicidal Intent Without Specific Plan Yes  5. Suicide Intent with Specific Plan No  6. Suicide Behavior Question Yes  7. How long ago did you do any of these? Over a year ago  [place]  Patient location: Urgent Care  UC Suicide Bundle Interventions High Risk Interventions implemented   NP notified

## 2021-08-25 NOTE — Discharge Instructions (Addendum)
If you develop fever, coughing up blood, trouble breathing, or any other new/concerning symptoms, then return to the ER for evaluation

## 2021-08-25 NOTE — ED Triage Notes (Signed)
Pt presents with HA, cough, sob,chest pain with coughing, congestion for 2 weeks and he is not feeling any better. 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 Pt endorses generalized aches and pains and fevers as well. Pt has not been taking any otc medication for his symptoms.

## 2021-08-25 NOTE — ED Notes (Signed)
TTS in progress 
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
## 2021-08-25 NOTE — ED Triage Notes (Signed)
Per EMS, patient from UC, originally to be seen for asthma exacerbation, upon SI screening, patient reports suicidal with plan. No wheezing. Denies psych history.

## 2021-08-25 NOTE — ED Notes (Signed)
Ems at bedside to take patient to ER

## 2021-08-25 NOTE — ED Provider Notes (Signed)
[facility]    CSN: [id number] Arrival date & time: 08/25/21  1038      History   Chief Complaint Chief Complaint  Patient presents with   Chills   Headache    HPI Jeremy Copeland is a 21 y.o. male.   Patient presents with headache and nonproductive cough, shortness of breath, wheezing, nasal congestion that has been present for 2 weeks.  Denies any known fevers but states that he has "felt feverish".  Denies any known sick contacts.  Patient does have history of asthma but reports that he does not have any inhalers for his asthma.  Patient has not taken any medications to help alleviate symptoms.  Shortness of breath is constant and not intermittent.  Denies sore throat, ear pain, nausea, vomiting, diarrhea, abdominal pain.  Patient also reported to the nurse during triage that he was having suicidal thoughts and ideation.  Patient reports that he has had these thoughts for approximately 1.5 years.  When patient was asked if he had a plan to act on these thoughts, he stated "I do not know".  He reports that "I have felt like this for a while but I have parents".  Patient reports that he has never been seen or evaluated for mental health or suicidal ideation.   Headache  Past Medical History:  Diagnosis Date   Asthma    no inhaler   Depression     Patient Active Problem List   Diagnosis Date Noted   Asthma, not well controlled 07/17/2020   Chronic rhinitis 07/17/2020    Past Surgical History:  Procedure Laterality Date   HEMORRHOID SURGERY N/A 03/20/2021   Procedure: HEMORRHOIDECTOMY PROLAPSED;  Surgeon: Dwan Bolt, MD;  Location: [place];  Service: General;  Laterality: N/A;   NO PAST SURGERIES     RECTAL EXAM UNDER ANESTHESIA N/A 03/20/2021   Procedure: RECTAL EXAM UNDER ANESTHESIA;  Surgeon: Dwan Bolt, MD;  Location: [place];  Service: General;  Laterality: N/A;       Home Medications    Prior to Admission medications   Medication Sig Start  Date End Date Taking? Authorizing Provider  albuterol (VENTOLIN HFA) 108 (90 Base) MCG/ACT inhaler Inhale 2 puffs into the lungs every 4 (four) hours as needed for wheezing or shortness of breath. 07/05/21   Jaynee Eagles, PA-C  budesonide-formoterol [facility]) 160-4.5 MCG/ACT inhaler Inhale 2 puffs into the lungs in the morning and at bedtime. with spacer and rinse mouth afterwards. Patient not taking: No sig reported 08/14/20   Garnet Sierras, DO  cetirizine (ZYRTEC ALLERGY) 10 MG tablet Take 1 tablet (10 mg total) by mouth daily. 07/05/21   Jaynee Eagles, PA-C  dicyclomine (BENTYL) 20 MG tablet Take 1 tablet (20 mg total) by mouth 4 (four) times daily -  before meals and at bedtime. Patient not taking: No sig reported 05/12/20   Wieters, Hallie C, PA-C  etodolac (LODINE) 400 MG tablet Take 1 tablet (400 mg total) by mouth 2 (two) times daily. 05/10/21   Pearson Forster, NP  fluticasone (FLONASE) 50 MCG/ACT nasal spray Place 1-2 sprays into both nostrils daily for 7 days. Patient not taking: Reported on 02/22/2021 05/12/20 05/19/20  Wieters, Elesa Hacker, PA-C  HYDROcodone-acetaminophen (NORCO/VICODIN) 5-325 MG tablet Take 1 tablet by mouth every 6 (six) hours as needed for severe pain. 03/20/21   Dwan Bolt, MD  montelukast (SINGULAIR) 10 MG tablet Take 1 tablet (10 mg total) by mouth at bedtime. 07/05/21  Jaynee Eagles, PA-C  ondansetron (ZOFRAN-ODT) 8 MG disintegrating tablet Take 1 tablet (8 mg total) by mouth every 8 (eight) hours as needed for nausea or vomiting. 07/05/21   Jaynee Eagles, PA-C  predniSONE (DELTASONE) 20 MG tablet Take 2 tablets daily with breakfast. 07/05/21   Jaynee Eagles, PA-C  promethazine-dextromethorphan (PROMETHAZINE-DM) 6.25-15 MG/5ML syrup Take 5 mLs by mouth at bedtime as needed for cough. 07/05/21   Jaynee Eagles, PA-C  tiZANidine (ZANAFLEX) 4 MG tablet Take 1 tablet (4 mg total) by mouth every 6 (six) hours as needed for muscle spasms. 05/10/21   Pearson Forster, NP    Family  History Family History  Problem Relation Age of Onset   Healthy Mother    Healthy Father    Heart disease Maternal Grandfather    Heart disease Maternal Uncle    Heart disease Maternal Uncle    Heart disease Cousin     Social History Social History   Tobacco Use   Smoking status: Never   Smokeless tobacco: Never  Vaping Use   Vaping Use: Some days   Last attempt to quit: 08/24/2020   Substances: Nicotine, CBD, Flavoring  Substance Use Topics   Alcohol use: Not Currently    Comment: pt st he drinks socially   Drug use: Never     Allergies   Patient has no known allergies.   Review of Systems Review of Systems Per HPI  Physical Exam Triage Vital Signs ED Triage Vitals  Enc Vitals Group     BP 08/25/21 1134 131/81     Pulse Rate 08/25/21 1134 82     Resp 08/25/21 1134 18     Temp 08/25/21 1134 98.9 F (37.2 C)     Temp Source 08/25/21 1134 Oral     SpO2 08/25/21 1134 95 %     Weight 08/25/21 1129 215 lb (97.5 kg)     Height 08/25/21 1129 5\' 11"  (1.803 m)     Head Circumference --      Peak Flow --      Pain Score 08/25/21 1129 10     Pain Loc --      Pain Edu? --      Excl. in [place]? --    No data found.  Updated Vital Signs BP 131/81 (BP Location: Right Arm)    Pulse 82    Temp 98.9 F (37.2 C) (Oral)    Resp 18    Ht 5\' 11"  (1.803 m)    Wt 215 lb (97.5 kg)    SpO2 95%    BMI 29.99 kg/m   Visual Acuity Right Eye Distance:   Left Eye Distance:   Bilateral Distance:    Right Eye Near:   Left Eye Near:    Bilateral Near:     Physical Exam Constitutional:      General: He is not in acute distress.    Appearance: Normal appearance. He is not toxic-appearing or diaphoretic.  HENT:     Head: Normocephalic and atraumatic.     Right Ear: Tympanic membrane and ear canal normal.     Left Ear: Tympanic membrane and ear canal normal.     Nose: Congestion present.     Mouth/Throat:     Mouth: Mucous membranes are moist.     Pharynx: No posterior  oropharyngeal erythema.  Eyes:     Extraocular Movements: Extraocular movements intact.     Conjunctiva/sclera: Conjunctivae normal.     Pupils: Pupils are equal, round,  and reactive to light.  Cardiovascular:     Rate and Rhythm: Normal rate and regular rhythm.     Pulses: Normal pulses.     Heart sounds: Normal heart sounds.  Pulmonary:     Effort: Pulmonary effort is normal. No respiratory distress.     Breath sounds: Normal breath sounds. No stridor. No wheezing, rhonchi or rales.  Abdominal:     General: Abdomen is flat. Bowel sounds are normal.     Palpations: Abdomen is soft.  Musculoskeletal:        General: Normal range of motion.     Cervical back: Normal range of motion.  Skin:    General: Skin is warm and dry.  Neurological:     General: No focal deficit present.     Mental Status: He is alert and oriented to person, place, and time. Mental status is at baseline.  Psychiatric:        Mood and Affect: Mood normal.        Behavior: Behavior normal.     UC Treatments / Results  Labs (all labs ordered are listed, but only abnormal results are displayed) Labs Reviewed - No data to display  EKG   Radiology No results found.  Procedures Procedures (including critical care time)  Medications Ordered in UC Medications - No data to display  Initial Impression / Assessment and Plan / UC Course  I have reviewed the triage vital signs and the nursing notes.  Pertinent labs & imaging results that were available during my care of the patient were reviewed by me and considered in my medical decision making (see chart for details).     It appears that patient has a viral upper respiratory infection with possible asthma exacerbation.  Although, patient does not have any adventitious lung sounds on exam and oxygen appears fairly normal.  Vital signs are stable.  Patient is having suicidal ideation with possible plan.  Patient will need to go to the hospital for further  evaluation, management, monitoring due to suicidal ideation and to be medically cleared for evaluation.  Ambulance called and patient sent to the hospital via EMS. Final Clinical Impressions(s) / UC Diagnoses   Final diagnoses:  Suicidal ideation  Acute upper respiratory infection  Acute cough     Discharge Instructions      Patient sent to the hospital via EMS due to suicidal ideation and to be medically cleared.    ED Prescriptions   None    PDMP not reviewed this encounter.   Teodora Medici, [place] 08/25/21 1205

## 2021-08-25 NOTE — Discharge Instructions (Signed)
Patient sent to the hospital via EMS due to suicidal ideation and to be medically cleared.

## 2021-08-25 NOTE — ED Notes (Signed)
[facility] at bedside with patient.

## 2021-08-25 NOTE — ED Provider Notes (Signed)
[facility] DEPT Provider Note   CSN: [id number] Arrival date & time: 08/25/21  1308     History  Chief Complaint  Patient presents with   Suicidal    Jeremy Copeland is a 21 y.o. male.  HPI  Is a 21 year old old male with history of asthma presenting due to suicidal ideations.  He was initially seen at urgent care due to nasal congestion, body aches and cough x2 weeks.  During that time he expressed suicidal ideations and was told to come to the ED.  Patient been having flu symptoms for about 2 weeks now.  He has been out of his albuterol inhaler since March, has not had an use it since then.  Denies feeling short of breath, the cough is nonproductive and intermittent.  He has not tried anything to help with the symptoms.  No obvious aggravating factors.  When discussing the suicidal ideations, patient is not very forthcoming.  He says he has been feeling "bad" for the last year and a half.  He has never sought psychiatric treatment, no psych history.  He does vape cigarettes, he does not answer question about any illicit drug use or alcohol use.  Patient does report he is had suicidal ideations, states when asked if he has a plan he states "the way I am living is the plan".  When asked to expand on this he does not.  Patient denies any previous attempts of suicide.  He still having any homicidal ideations, denies any hallucinations or delusions.   Home Medications Prior to Admission medications   Medication Sig Start Date End Date Taking? Authorizing Provider  albuterol (VENTOLIN HFA) 108 (90 Base) MCG/ACT inhaler Inhale 2 puffs into the lungs every 4 (four) hours as needed for wheezing or shortness of breath. 07/05/21   Jaynee Eagles, PA-C  budesonide-formoterol [facility]) 160-4.5 MCG/ACT inhaler Inhale 2 puffs into the lungs in the morning and at bedtime. with spacer and rinse mouth afterwards. Patient not taking: No sig reported 08/14/20   Garnet Sierras,  DO  cetirizine (ZYRTEC ALLERGY) 10 MG tablet Take 1 tablet (10 mg total) by mouth daily. 07/05/21   Jaynee Eagles, PA-C  dicyclomine (BENTYL) 20 MG tablet Take 1 tablet (20 mg total) by mouth 4 (four) times daily -  before meals and at bedtime. Patient not taking: No sig reported 05/12/20   Wieters, Hallie C, PA-C  etodolac (LODINE) 400 MG tablet Take 1 tablet (400 mg total) by mouth 2 (two) times daily. 05/10/21   Pearson Forster, NP  fluticasone (FLONASE) 50 MCG/ACT nasal spray Place 1-2 sprays into both nostrils daily for 7 days. Patient not taking: Reported on 02/22/2021 05/12/20 05/19/20  Wieters, Elesa Hacker, PA-C  HYDROcodone-acetaminophen (NORCO/VICODIN) 5-325 MG tablet Take 1 tablet by mouth every 6 (six) hours as needed for severe pain. 03/20/21   Dwan Bolt, MD  montelukast (SINGULAIR) 10 MG tablet Take 1 tablet (10 mg total) by mouth at bedtime. 07/05/21   Jaynee Eagles, PA-C  ondansetron (ZOFRAN-ODT) 8 MG disintegrating tablet Take 1 tablet (8 mg total) by mouth every 8 (eight) hours as needed for nausea or vomiting. 07/05/21   Jaynee Eagles, PA-C  predniSONE (DELTASONE) 20 MG tablet Take 2 tablets daily with breakfast. 07/05/21   Jaynee Eagles, PA-C  promethazine-dextromethorphan (PROMETHAZINE-DM) 6.25-15 MG/5ML syrup Take 5 mLs by mouth at bedtime as needed for cough. 07/05/21   Jaynee Eagles, PA-C  tiZANidine (ZANAFLEX) 4 MG tablet Take 1 tablet (4 mg  total) by mouth every 6 (six) hours as needed for muscle spasms. 05/10/21   Pearson Forster, NP      Allergies    Patient has no known allergies.    Review of Systems   Review of Systems  Constitutional:  Negative for chills and fever.  HENT:  Positive for congestion and sore throat. Negative for ear pain.   Eyes:  Negative for pain and visual disturbance.  Respiratory:  Positive for cough. Negative for shortness of breath.   Cardiovascular:  Negative for chest pain and palpitations.  Gastrointestinal:  Negative for abdominal pain and vomiting.   Genitourinary:  Negative for dysuria and hematuria.  Musculoskeletal:  Positive for myalgias. Negative for arthralgias and back pain.  Skin:  Negative for color change and rash.  Neurological:  Positive for headaches. Negative for seizures and syncope.  Psychiatric/Behavioral:  Positive for sleep disturbance and suicidal ideas. Negative for hallucinations and self-injury.   All other systems reviewed and are negative.  Physical Exam Updated Vital Signs BP (!) 146/92 (BP Location: Right Arm)    Pulse 86    Temp 99.8 F (37.7 C) (Oral)    Resp 18    SpO2 98%  Physical Exam Vitals and nursing note reviewed. Exam conducted with a chaperone present.  Constitutional:      Appearance: Normal appearance.  HENT:     Head: Normocephalic and atraumatic.  Eyes:     General: No scleral icterus.       Right eye: No discharge.        Left eye: No discharge.     Extraocular Movements: Extraocular movements intact.     Pupils: Pupils are equal, round, and reactive to light.  Cardiovascular:     Rate and Rhythm: Normal rate and regular rhythm.     Pulses: Normal pulses.     Heart sounds: Normal heart sounds. No murmur heard.   No friction rub. No gallop.  Pulmonary:     Effort: Pulmonary effort is normal. No respiratory distress.     Breath sounds: Normal breath sounds.     Comments: Lungs are clear to auscultation bilaterally, patient unable to do forced expiration (limited by effort) Abdominal:     General: Abdomen is flat. Bowel sounds are normal. There is no distension.     Palpations: Abdomen is soft.     Tenderness: There is no abdominal tenderness.  Skin:    General: Skin is warm and dry.     Coloration: Skin is not jaundiced.  Neurological:     Mental Status: He is alert. Mental status is at baseline.     Coordination: Coordination normal.  Psychiatric:        Attention and Perception: Attention normal.        Mood and Affect: Affect is flat.        Speech: He is  noncommunicative.        Behavior: Behavior is slowed and withdrawn. Behavior is cooperative.        Thought Content: Thought content includes suicidal ideation. Thought content does not include homicidal ideation.     Comments: Patient has flat affect, does not maintain eye contact.  Not particularly communicative in speech, will answer questions selectively   ED Results / Procedures / Treatments   Labs (all labs ordered are listed, but only abnormal results are displayed) Labs Reviewed  RESP PANEL BY RT-PCR (FLU A&B, COVID) [id number]  COMPREHENSIVE METABOLIC PANEL  ETHANOL  RAPID URINE DRUG SCREEN, HOSP  PERFORMED  CBC WITH DIFFERENTIAL/PLATELET    EKG None  Radiology No results found.  Procedures Procedures    Medications Ordered in ED Medications  albuterol (VENTOLIN HFA) 108 (90 Base) MCG/ACT inhaler 2 puff (has no administration in time range)  dexamethasone (DECADRON) tablet 6 mg (has no administration in time range)  acetaminophen (TYLENOL) tablet 1,000 mg (has no administration in time range)    ED Course/ Medical Decision Making/ A&P                           Medical Decision Making This is a 21 year old male with history of asthma presenting due to leg symptoms and suicidal ideations.  His COVID test is negative, his lungs are clear to auscultation but the physical exam was limited secondary to effort.  We will refill his albuterol inhaler here and give Decadron.  I do not feel that this is an asthma exacerbation.  Patient does present with suicidal ideations, no history of the same.  He is not forthcoming when he comes to a plan.  I do think he is credible around needs psychiatric evaluation.  At this time I do feel he is medically cleared, consult to TTS made  Amount and/or Complexity of Data Reviewed External Data Reviewed: labs and notes.    Details: Note from urgent care reviewed Labs: ordered. Decision-making details documented in ED Course.    Details: Lab  work without any gross leukocytosis or anemia.  No gross electrolyte derangement. ECG/medicine tests: ordered.    Details: Patient with sinus rhythm, no tachycardia  Risk OTC drugs. Prescription drug management. Decision regarding hospitalization. Risk Details: Patient at this time is voluntary.  He is agreeable to stay for psychiatric evaluation.  We contacted safety, do not feel strongly at this time that he needs an IVC.  If he were to leave before being seen by psychiatry he would be a danger to himself and at that time would need to be IVC.           Final Clinical Impression(s) / ED Diagnoses Final diagnoses:  None    Rx / DC Orders ED Discharge Orders     None         Sherrill Raring, Hershal Coria 08/25/21 1531    Lacretia Leigh, MD 08/28/21 1302

## 2021-08-25 NOTE — ED Notes (Signed)
Patient c/o pain to L cheek in mouth x1 week. MD aware.

## 2021-08-25 NOTE — ED Notes (Signed)
Family member at bedside.

## 2021-08-25 NOTE — ED Notes (Signed)
Ems called for pt transport to hospital for further evaluation

## 2021-08-25 NOTE — ED Notes (Signed)
Nurse went in and talked with patient and explained to pt the plan of care. Pt expressed experiencing nausea NP notified.

## 2021-08-25 NOTE — ED Provider Notes (Addendum)
I was asked to see patient because he is complaining of left cheek/mouth pain.  On exam it looks like his left mandibular molar is cutting into his cheek a little bit.  Small wound there.  No obvious purulent drainage.  He states is hurting him so bad he cannot eat.  We will give Peridex and viscous lidocaine.  He is currently awaiting TTS evaluation.   Sherwood Gambler, MD 08/25/21 1735  Patient was cleared by psychiatry.  When I talk to patient he is denying any suicidal thoughts or intents.  He appears stable for discharge.  Will prescribe viscous lidocaine for his cheek and will give a steroid burst for his mild asthma exacerbation for a couple weeks.   Sherwood Gambler, MD 08/25/21 Vernelle Emerald

## 2021-11-05 IMAGING — CR DG CHEST 2V
2 series · 2 of 2 positions shown · non-contrast
Comparison: April 28, 2020

CLINICAL DATA: Shortness of breath.

EXAM:
CHEST - 2 VIEW

[chest pa]
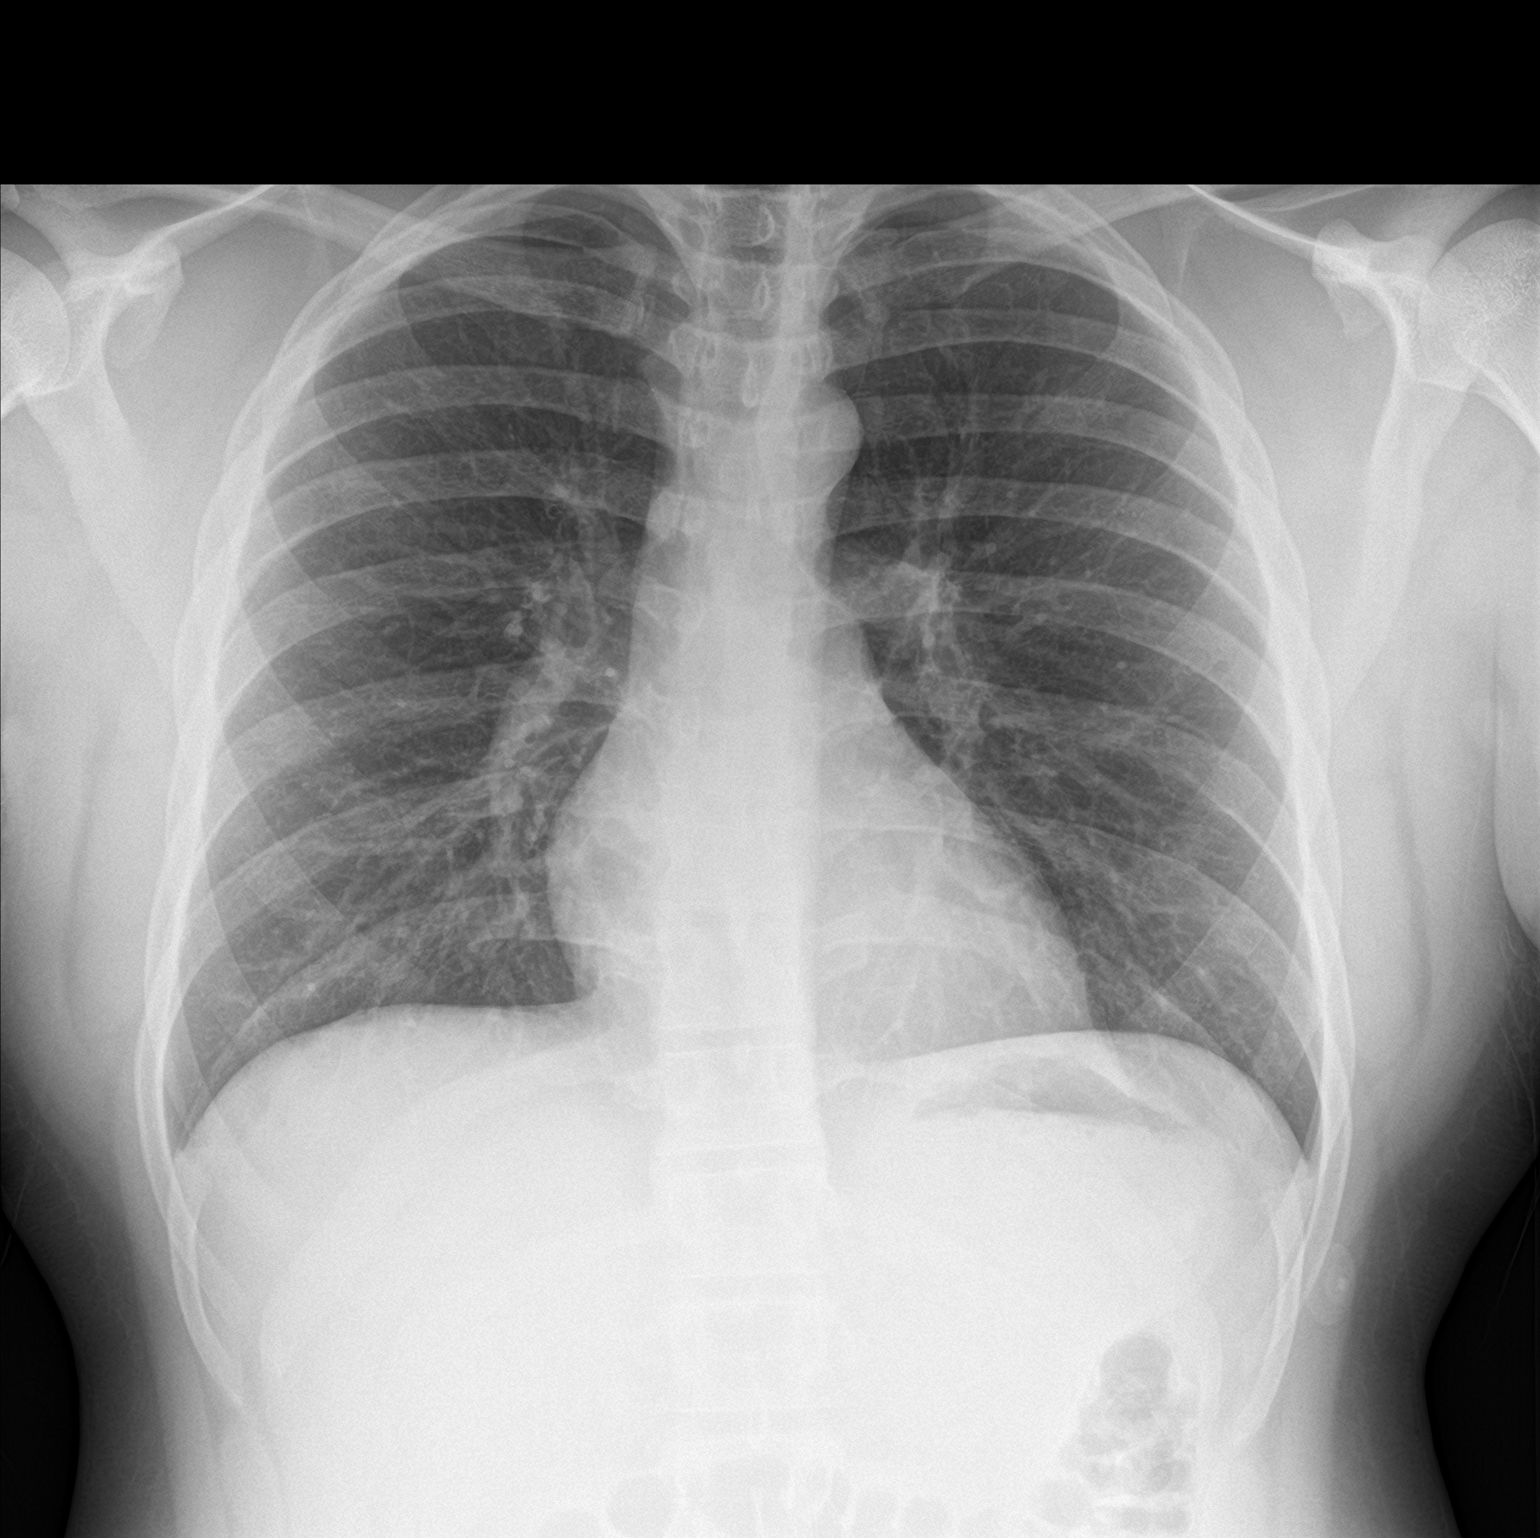

[chest lat]
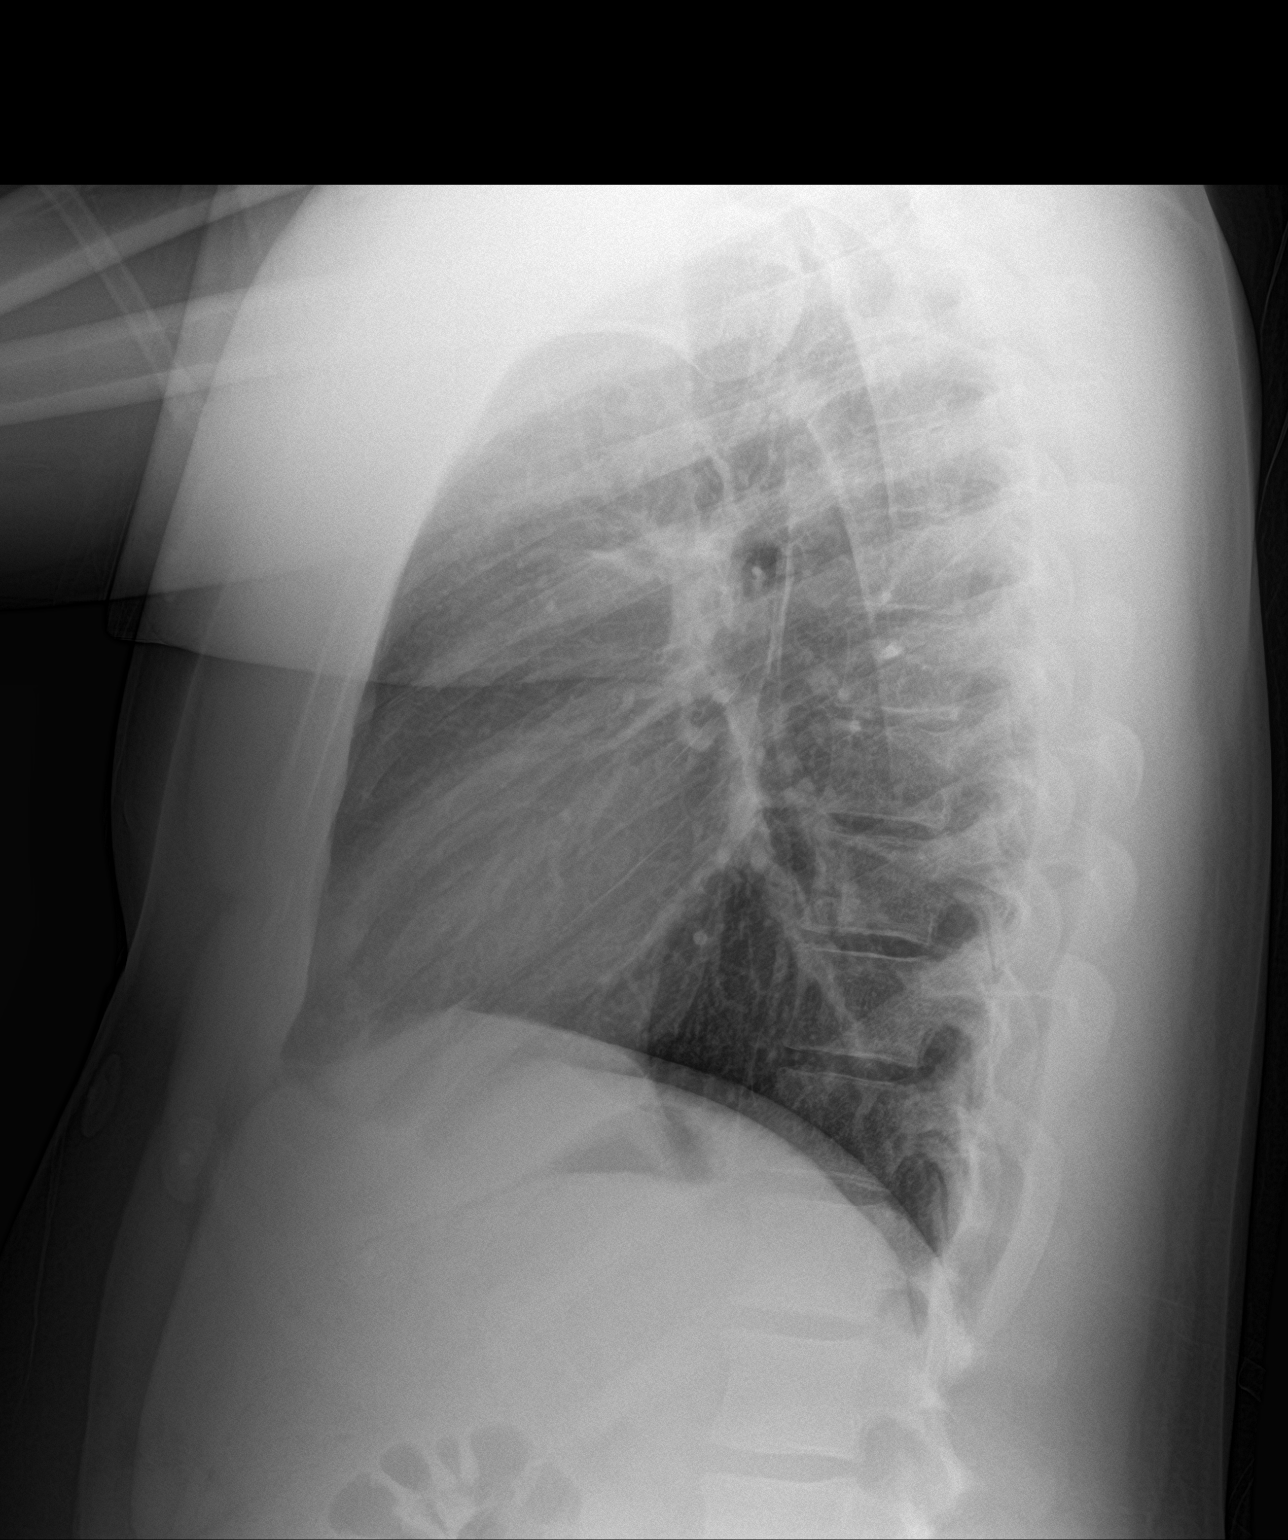

[2 of 2 positions shown; findings below may reference images not displayed]

FINDINGS: The heart size and mediastinal contours are within normal limits.
Both lungs are clear. The visualized skeletal structures are
unremarkable.
IMPRESSION: No active cardiopulmonary disease.

## 2022-07-09 IMAGING — DX DG CHEST 2V
2 series · 2 of 2 positions shown · non-contrast
Comparison: 11/01/2020

CLINICAL DATA: Cough history of asthma

EXAM:
CHEST - 2 VIEW

[chest pa]
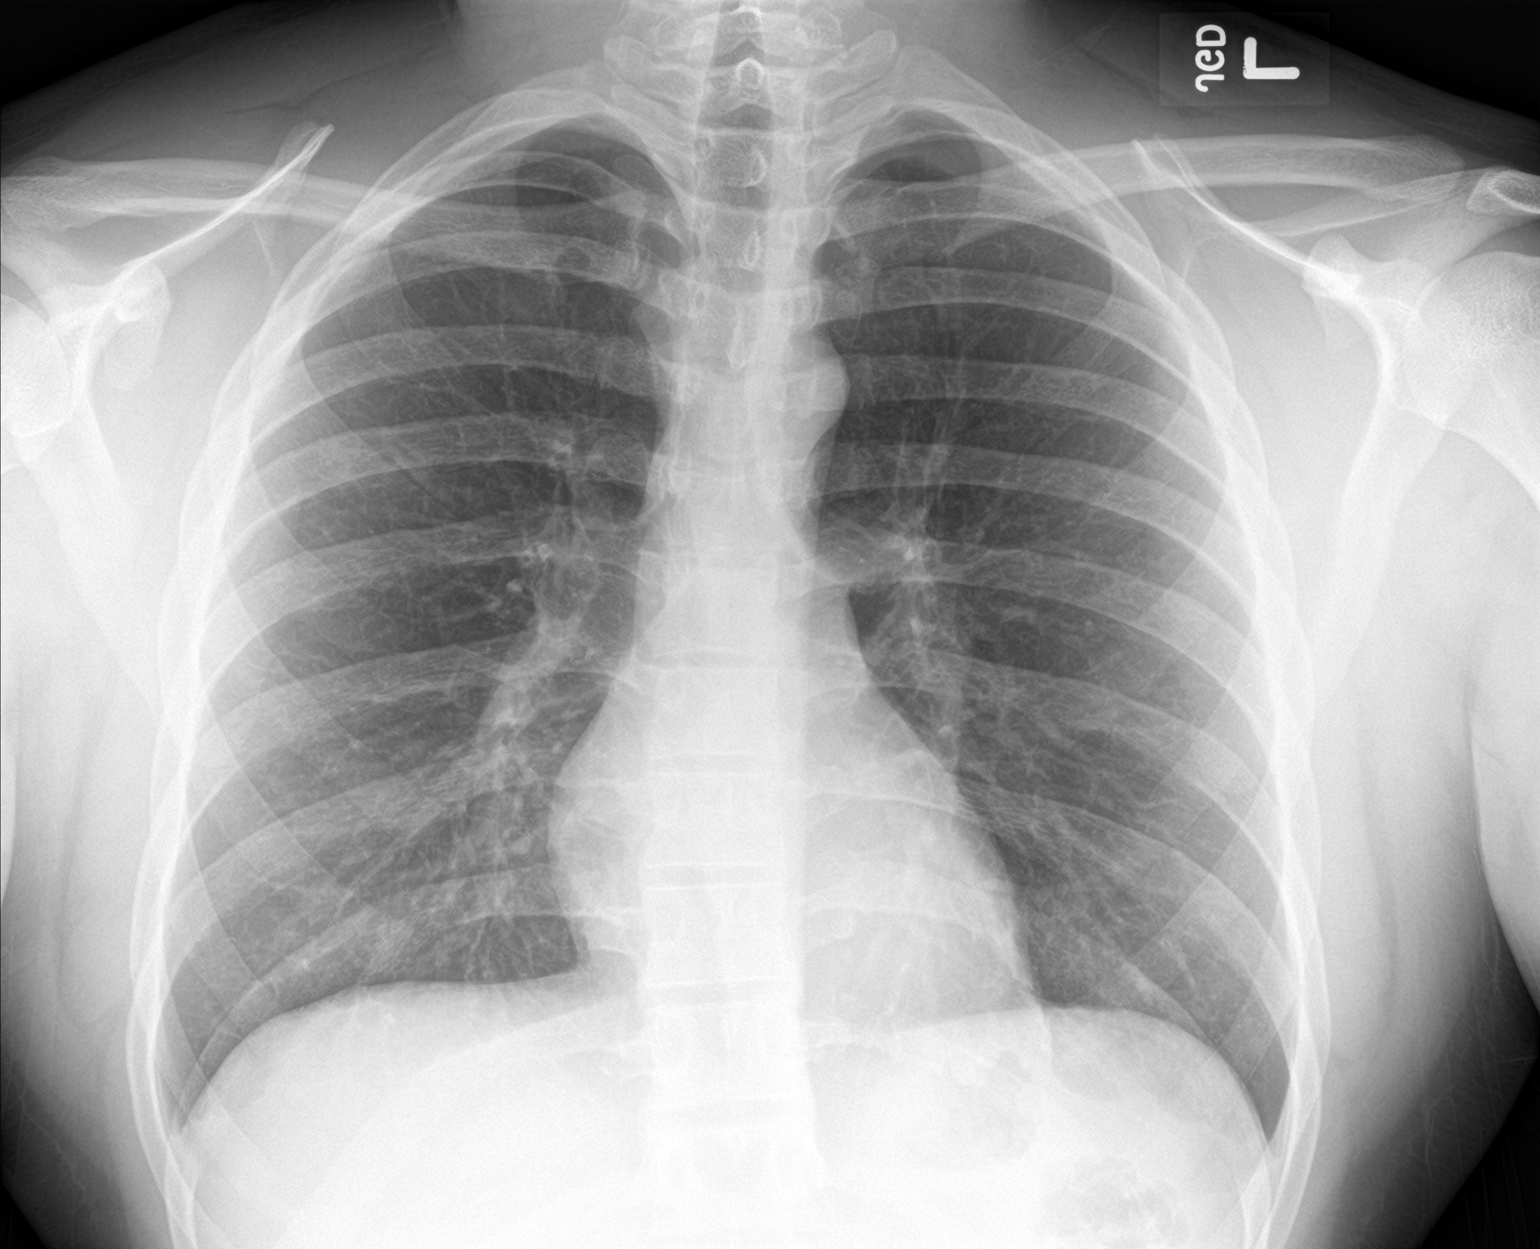

[chest lat]
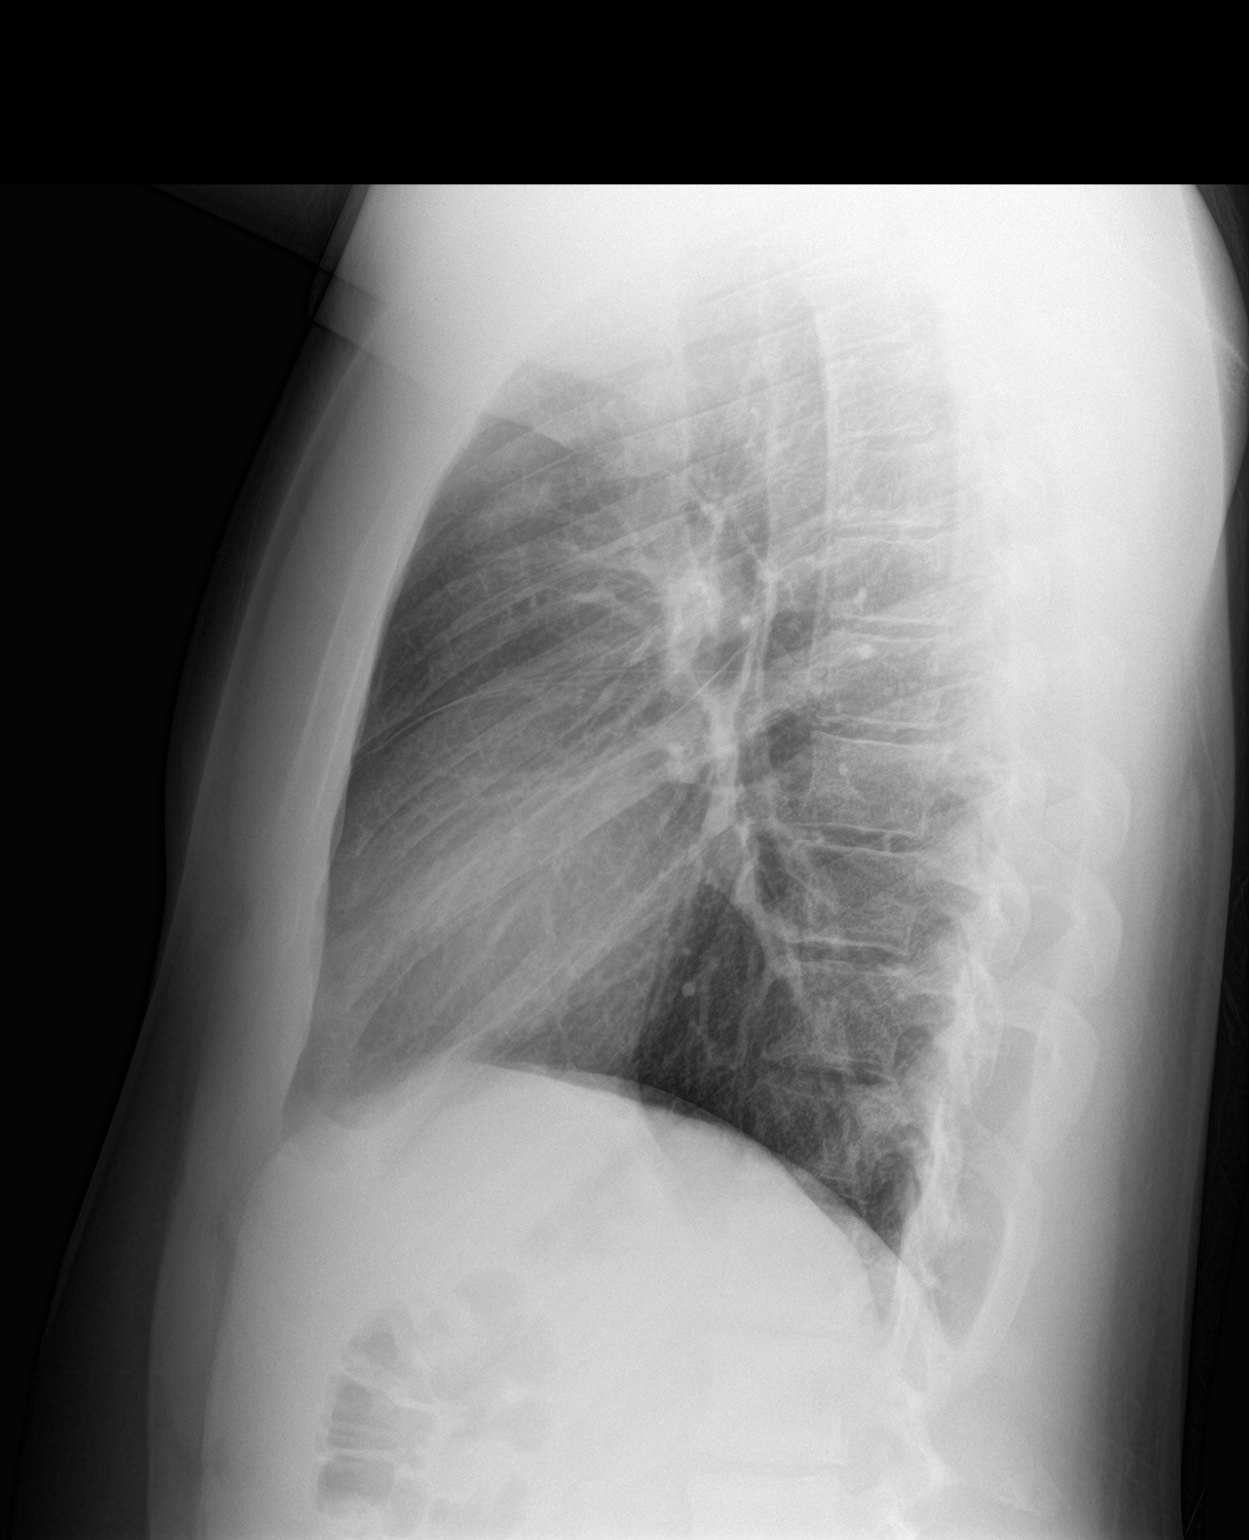

[2 of 2 positions shown; findings below may reference images not displayed]

FINDINGS: The heart size and mediastinal contours are within normal limits.
Both lungs are clear. The visualized skeletal structures are
unremarkable.
IMPRESSION: No active cardiopulmonary disease.

## 2023-05-25 ENCOUNTER — Ambulatory Visit (INDEPENDENT_AMBULATORY_CARE_PROVIDER_SITE_OTHER): Payer: Managed Care, Other (non HMO)

## 2023-05-25 ENCOUNTER — Encounter (HOSPITAL_COMMUNITY): Payer: Self-pay | Admitting: Emergency Medicine

## 2023-05-25 ENCOUNTER — Ambulatory Visit (HOSPITAL_COMMUNITY)
Admission: EM | Admit: 2023-05-25 | Discharge: 2023-05-25 | Disposition: A | Discharge status: Home / Self Care | Payer: Managed Care, Other (non HMO) | Attending: Physician Assistant | Admitting: Physician Assistant

## 2023-05-25 ENCOUNTER — Other Ambulatory Visit: Payer: Self-pay

## 2023-05-25 DIAGNOSIS — R1084 Generalized abdominal pain: Secondary | ICD-10-CM | POA: Diagnosis not present

## 2023-05-25 DIAGNOSIS — E860 Dehydration: Secondary | ICD-10-CM | POA: Insufficient documentation

## 2023-05-25 DIAGNOSIS — R112 Nausea with vomiting, unspecified: Secondary | ICD-10-CM | POA: Diagnosis not present

## 2023-05-25 LAB — CBC WITH DIFFERENTIAL/PLATELET
Abs Immature Granulocytes: 0 10*3/uL (ref 0.00–0.07)
Basophils Absolute: 0 10*3/uL (ref 0.0–0.1)
Basophils Relative: 0 %
Eosinophils Absolute: 0.2 10*3/uL (ref 0.0–0.5)
Eosinophils Relative: 4 %
HCT: 46.4 % (ref 39.0–52.0)
Hemoglobin: 16 g/dL (ref 13.0–17.0)
Immature Granulocytes: 0 %
Lymphocytes Relative: 28 %
Lymphs Abs: 1.3 10*3/uL (ref 0.7–4.0)
MCH: 28.9 pg (ref 26.0–34.0)
MCHC: 34.5 g/dL (ref 30.0–36.0)
MCV: 83.9 fL (ref 80.0–100.0)
Monocytes Absolute: 0.7 10*3/uL (ref 0.1–1.0)
Monocytes Relative: 15 %
Neutro Abs: 2.5 10*3/uL (ref 1.7–7.7)
Neutrophils Relative %: 53 %
Platelets: 296 10*3/uL (ref 150–400)
RBC: 5.53 MIL/uL (ref 4.22–5.81)
RDW: 12.1 % (ref 11.5–15.5)
WBC: 4.7 10*3/uL (ref 4.0–10.5)
nRBC: 0 % (ref 0.0–0.2)

## 2023-05-25 LAB — COMPREHENSIVE METABOLIC PANEL
ALT: 17 U/L (ref 0–44)
AST: 25 U/L (ref 15–41)
Albumin: 4.1 g/dL (ref 3.5–5.0)
Alkaline Phosphatase: 47 U/L (ref 38–126)
Anion gap: 11 (ref 5–15)
BUN: 15 mg/dL (ref 6–20)
CO2: 25 mmol/L (ref 22–32)
Calcium: 9.5 mg/dL (ref 8.9–10.3)
Chloride: 103 mmol/L (ref 98–111)
Creatinine, Ser: 0.93 mg/dL (ref 0.61–1.24)
GFR, Estimated: >60 mL/min (ref >60)
Glucose, Bld: 73 mg/dL (ref 70–99)
Potassium: 3.6 mmol/L (ref 3.5–5.1)
Sodium: 139 mmol/L (ref 135–145)
Total Bilirubin: 1 mg/dL (ref 0.3–1.2)
Total Protein: 8.1 g/dL (ref 6.5–8.1)

## 2023-05-25 LAB — POCT URINALYSIS DIP (MANUAL ENTRY)
Glucose, UA: NEGATIVE mg/dL
Leukocytes, UA: NEGATIVE
Nitrite, UA: NEGATIVE
Protein Ur, POC: 100 mg/dL — AB
Spec Grav, UA: >=1.03 — AB (ref 1.010–1.025)
Urobilinogen, UA: 0.2 U/dL
pH, UA: 6 (ref 5.0–8.0)

## 2023-05-25 LAB — LIPASE, BLOOD: Lipase: 24 U/L (ref 11–51)

## 2023-05-25 MED ORDER — ONDANSETRON 4 MG PO TBDP
4.0000 mg | ORAL_TABLET | Freq: Once | ORAL | Status: AC
  Administered 2023-05-25: 4 mg via ORAL

## 2023-05-25 MED ORDER — SODIUM CHLORIDE 0.9 % IV BOLUS
1000.0000 mL | Freq: Once | INTRAVENOUS | Status: AC
  Administered 2023-05-25: 1000 mL via INTRAVENOUS

## 2023-05-25 MED ORDER — ONDANSETRON 4 MG PO TBDP: 4.0000 mg | ORAL_TABLET | Freq: Three times a day (TID) | ORAL | 0 refills | Status: DC | PRN

## 2023-05-25 MED ORDER — ONDANSETRON 4 MG PO TBDP
ORAL_TABLET | ORAL | Status: AC
  Filled 2023-05-25: qty 1

## 2023-05-25 NOTE — ED Triage Notes (Signed)
Patient reports abdominal pain and vomiting , has a headache.  Symptoms since Thursday night/Friday morning.  Patient vomited x 1 today.  Denies diarrhea  Has had ibuprofen, mucinex

## 2023-05-25 NOTE — ED Provider Notes (Signed)
[facility]    CSN: [id number] Arrival date & time: 05/25/23  1335      History   Chief Complaint Chief Complaint  Patient presents with   Abdominal Pain    HPI Jeremy Copeland is a 22 y.o. male.   Patient presents today with a 3 to 4-day history of abdominal pain.  Reports pain is rated 8 on a 0-10 pain scale, worse in his right and upper abdomen, described as sharp, no relieving factors identified.  He reports his last bowel movement was sometime last week but he cannot remember exactly when it was.  He is unsure if he is passing gas.  Denies any suspicious food intake, recent travel, medication changes, antibiotic use.  He has not had much solid food in several days because of the severity of symptoms.  He has been pushing fluids but continues to have nausea and vomiting.  He describes vomiting as yellow in color with occasional blood streaks but denies any frank hematemesis.  He denies history of gastrointestinal disorder.  Denies previous abdominal surgery and still has gallbladder and appendix.  He has tried Tylenol and ibuprofen without improvement of symptoms.  He is also experiencing headache and mild cough.  He is anxious to feel better to get back to work.    Past Medical History:  Diagnosis Date   Asthma    no inhaler   Depression     Patient Active Problem List   Diagnosis Date Noted   Asthma, not well controlled 07/17/2020   Chronic rhinitis 07/17/2020    Past Surgical History:  Procedure Laterality Date   HEMORRHOID SURGERY N/A 03/20/2021   Procedure: HEMORRHOIDECTOMY PROLAPSED;  Surgeon: Fritzi Mandes, MD;  Location: [facility] OR;  Service: General;  Laterality: N/A;   NO PAST SURGERIES     RECTAL EXAM UNDER ANESTHESIA N/A 03/20/2021   Procedure: RECTAL EXAM UNDER ANESTHESIA;  Surgeon: Fritzi Mandes, MD;  Location: [facility] OR;  Service: General;  Laterality: N/A;       Home Medications    Prior to Admission medications   Medication Sig Start Date End  Date Taking? Authorizing Provider  ondansetron (ZOFRAN-ODT) 4 MG disintegrating tablet Take 1 tablet (4 mg total) by mouth every 8 (eight) hours as needed for nausea or vomiting. 05/25/23  Yes Heavenly Christine K, PA-C  albuterol (VENTOLIN HFA) 108 (90 Base) MCG/ACT inhaler Inhale 2 puffs into the lungs every 4 (four) hours as needed for wheezing or shortness of breath. Patient not taking: Reported on 08/25/2021 07/05/21   Wallis Bamberg, PA-C  cetirizine (ZYRTEC ALLERGY) 10 MG tablet Take 1 tablet (10 mg total) by mouth daily. Patient not taking: Reported on 08/25/2021 07/05/21   Wallis Bamberg, PA-C  etodolac (LODINE) 400 MG tablet Take 1 tablet (400 mg total) by mouth 2 (two) times daily. Patient not taking: Reported on 08/25/2021 05/10/21   Ivette Loyal, NP  HYDROcodone-acetaminophen (NORCO/VICODIN) 5-325 MG tablet Take 1 tablet by mouth every 6 (six) hours as needed for severe pain. Patient not taking: Reported on 08/25/2021 03/20/21   Fritzi Mandes, MD  lidocaine (XYLOCAINE) 2 % solution Use as directed 15 mLs in the mouth or throat every 6 (six) hours as needed for mouth pain. Patient not taking: Reported on 05/25/2023 08/25/21   Pricilla Loveless, MD  montelukast (SINGULAIR) 10 MG tablet Take 1 tablet (10 mg total) by mouth at bedtime. Patient not taking: Reported on 08/25/2021 07/05/21   Wallis Bamberg, PA-C  predniSONE (DELTASONE)  20 MG tablet 3 tabs po day one, then 2 po daily x 4 days Patient not taking: Reported on 05/25/2023 08/25/21   Pricilla Loveless, MD  promethazine-dextromethorphan (PROMETHAZINE-DM) 6.25-15 MG/5ML syrup Take 5 mLs by mouth at bedtime as needed for cough. Patient not taking: Reported on 08/25/2021 07/05/21   Wallis Bamberg, PA-C  tiZANidine (ZANAFLEX) 4 MG tablet Take 1 tablet (4 mg total) by mouth every 6 (six) hours as needed for muscle spasms. Patient not taking: Reported on 08/25/2021 05/10/21   Ivette Loyal, NP    Family History Family History  Problem Relation Age of Onset   Healthy  Mother    Healthy Father    Heart disease Maternal Grandfather    Heart disease Maternal Uncle    Heart disease Maternal Uncle    Heart disease Cousin     Social History Social History   Tobacco Use   Smoking status: Never   Smokeless tobacco: Never  Vaping Use   Vaping status: Some Days   Last attempt to quit: 08/24/2020   Substances: Nicotine, CBD, Flavoring  Substance Use Topics   Alcohol use: Not Currently    Comment: pt st he drinks socially   Drug use: Never     Allergies   Patient has no known allergies.   Review of Systems Review of Systems  Constitutional:  Positive for activity change and appetite change. Negative for fatigue and fever.  Respiratory:  Positive for cough. Negative for shortness of breath.   Cardiovascular:  Negative for chest pain.  Gastrointestinal:  Positive for abdominal pain, nausea and vomiting. Negative for constipation and diarrhea.  Neurological:  Positive for headaches. Negative for dizziness and light-headedness.     Physical Exam Triage Vital Signs ED Triage Vitals  Encounter Vitals Group     BP 05/25/23 1440 (!) 145/81     Systolic BP Percentile --      Diastolic BP Percentile --      Pulse Rate 05/25/23 1440 81     Resp 05/25/23 1440 18     Temp 05/25/23 1440 98.8 F (37.1 C)     Temp Source 05/25/23 1440 Oral     SpO2 05/25/23 1440 95 %     Weight --      Height --      Head Circumference --      Peak Flow --      Pain Score 05/25/23 1435 8     Pain Loc --      Pain Education --      Exclude from Growth Chart --    No data found.  Updated Vital Signs BP (!) 145/81 (BP Location: Right Arm) Comment (BP Location): large cuff  Pulse 81   Temp 98.8 F (37.1 C) (Oral)   Resp 18   SpO2 95%   Visual Acuity Right Eye Distance:   Left Eye Distance:   Bilateral Distance:    Right Eye Near:   Left Eye Near:    Bilateral Near:     Physical Exam Vitals reviewed.  Constitutional:      General: He is awake.      Appearance: Normal appearance. He is well-developed. He is not ill-appearing.     Comments: Very pleasant male presented age in no acute distress sitting comfortably in exam room  HENT:     Head: Normocephalic and atraumatic.     Mouth/Throat:     Mouth: Mucous membranes are moist.     Pharynx: Uvula midline.  No oropharyngeal exudate or posterior oropharyngeal erythema.  Cardiovascular:     Rate and Rhythm: Normal rate and regular rhythm.     Heart sounds: Normal heart sounds, S1 normal and S2 normal. No murmur heard. Pulmonary:     Effort: Pulmonary effort is normal.     Breath sounds: No stridor. Wheezing present. No rhonchi or rales.     Comments: Occasional scattered wheezing. Abdominal:     General: Bowel sounds are normal.     Palpations: Abdomen is soft.     Tenderness: There is abdominal tenderness in the right upper quadrant, right lower quadrant, epigastric area and left upper quadrant. There is no right CVA tenderness, left CVA tenderness, guarding or rebound. Negative signs include Rovsing's sign, McBurney's sign and psoas sign.     Comments: Tenderness to palpation over right abdomen and epigastrium.  No evidence of acute abdomen on physical exam.  Negative special test.  Neurological:     Mental Status: He is alert.  Psychiatric:        Behavior: Behavior is cooperative.      UC Treatments / Results  Labs (all labs ordered are listed, but only abnormal results are displayed) Labs Reviewed  POCT URINALYSIS DIP (MANUAL ENTRY) - Abnormal; Notable for the following components:      Result Value   Clarity, UA cloudy (*)    Bilirubin, UA moderate (*)    Ketones, POC UA >= (160) (*)    Spec Grav, UA >=1.030 (*)    Blood, UA small (*)    Protein Ur, POC =100 (*)    All other components within normal limits  CBC WITH DIFFERENTIAL/PLATELET  COMPREHENSIVE METABOLIC PANEL  LIPASE, BLOOD    EKG   Radiology No results found.  Procedures Procedures (including  critical care time)  Medications Ordered in UC Medications  ondansetron (ZOFRAN-ODT) disintegrating tablet 4 mg (4 mg Oral Given 05/25/23 1520)  sodium chloride 0.9 % bolus 1,000 mL (0 mLs Intravenous Stopped 05/25/23 1721)    Initial Impression / Assessment and Plan / UC Course  I have reviewed the triage vital signs and the nursing notes.  Pertinent labs & imaging results that were available during my care of the patient were reviewed by me and considered in my medical decision making (see chart for details).     Patient was well-appearing, afebrile, nontoxic, nontachycardic.  He initially had significant tenderness palpation we discussed going to the emergency room but he will want to initiate treatment here.  Urine was obtained that shows significant dehydration and so we started him on IV fluids and he was given 1 L bolus.  With this he felt significantly better and abdominal pain resolved.  He was also given a dose of Zofran and able to pass oral challenge.  Prescription for Zofran was sent to the pharmacy and he was encouraged to use all scheduled basis for the next several days and then decrease use to as needed thereafter.  He is eat a bland diet drink plenty of fluids.  Basic labs including CBC, CMP, lipase were obtained and are normal.  Abdominal x-ray was obtained with no evidence of bowel obstruction.  We are still waiting on official read at the time of discharge and so we will contact him if this is abnormal and changes our treatment plan.  Recommended that he rest and drink plenty of fluid at home.  Discussed that if anything worsens and he has recurrent severe abdominal pain, nausea/vomiting despite antiemetic medication,  hematemesis, melena, hematochezia he needs to be seen immediately.  Strict return precautions given.  Work excuse note provided.  Final Clinical Impressions(s) / UC Diagnoses   Final diagnoses:  Generalized abdominal pain  Nausea and vomiting, unspecified  vomiting type  Dehydration     Discharge Instructions      I am glad that you are feeling better after the medication.  I believe that you are dehydrated and this is contributing to your symptoms.  Use Zofran every 8 hours as needed for nausea and vomiting.  Eat a bland diet and avoid spicy/acidic/fatty foods.  Make sure that you are resting and drinking plenty of fluid.  Your lab work was normal.  If you have any recurrent symptoms including nausea/vomiting despite medication, severe abdominal pain, fever, blood in your stool, blood in your vomit you need to be seen immediately in the emergency room.     ED Prescriptions     Medication Sig Dispense Auth. Provider   ondansetron (ZOFRAN-ODT) 4 MG disintegrating tablet Take 1 tablet (4 mg total) by mouth every 8 (eight) hours as needed for nausea or vomiting. 20 tablet Tijah Hane, Noberto Retort, PA-C      PDMP not reviewed this encounter.   Jeani Hawking, PA-C 05/25/23 1732

## 2023-05-25 NOTE — Discharge Instructions (Signed)
I am glad that you are feeling better after the medication.  I believe that you are dehydrated and this is contributing to your symptoms.  Use Zofran every 8 hours as needed for nausea and vomiting.  Eat a bland diet and avoid spicy/acidic/fatty foods.  Make sure that you are resting and drinking plenty of fluid.  Your lab work was normal.  If you have any recurrent symptoms including nausea/vomiting despite medication, severe abdominal pain, fever, blood in your stool, blood in your vomit you need to be seen immediately in the emergency room.

## 2023-08-18 ENCOUNTER — Encounter (HOSPITAL_COMMUNITY): Payer: Self-pay | Admitting: Emergency Medicine

## 2023-08-18 ENCOUNTER — Ambulatory Visit (HOSPITAL_COMMUNITY)
Admission: EM | Admit: 2023-08-18 | Discharge: 2023-08-18 | Disposition: A | Discharge status: Home / Self Care | Payer: Managed Care, Other (non HMO) | Attending: Nurse Practitioner | Admitting: Nurse Practitioner

## 2023-08-18 DIAGNOSIS — J101 Influenza due to other identified influenza virus with other respiratory manifestations: Secondary | ICD-10-CM | POA: Diagnosis not present

## 2023-08-18 LAB — POC COVID19/FLU A&B COMBO
Covid Antigen, POC: NEGATIVE
Influenza A Antigen, POC: POSITIVE — AB
Influenza B Antigen, POC: NEGATIVE

## 2023-08-18 MED ORDER — BENZONATATE 100 MG PO CAPS: 100.0000 mg | ORAL_CAPSULE | Freq: Three times a day (TID) | ORAL | 0 refills | Status: AC | PRN

## 2023-08-18 MED ORDER — ONDANSETRON 4 MG PO TBDP
4.0000 mg | ORAL_TABLET | Freq: Three times a day (TID) | ORAL | 0 refills | Status: AC | PRN
Start: 2023-08-18 — End: ?

## 2023-08-18 MED ORDER — OSELTAMIVIR PHOSPHATE 75 MG PO CAPS: 75.0000 mg | ORAL_CAPSULE | Freq: Two times a day (BID) | ORAL | 0 refills | Status: AC

## 2023-08-18 MED ORDER — PROMETHAZINE-DM 6.25-15 MG/5ML PO SYRP: 5.0000 mL | ORAL_SOLUTION | Freq: Every evening | ORAL | 0 refills | Status: AC | PRN

## 2023-08-18 NOTE — ED Triage Notes (Addendum)
PT c/o headache, cough, congestion, vomiting, and chills for 1 day.  Took Tylenol about 30 mins ago

## 2023-08-18 NOTE — ED Provider Notes (Signed)
[facility]    CSN: [id number] Arrival date & time: 08/18/23  1531      History   Chief Complaint Chief Complaint  Patient presents with   Headache   Nasal Congestion   Cough    HPI Jeremy Copeland is a 22 y.o. male.   Patient presents today with 1 day history of bodyaches and chills, congested and dry cough, runny and stuffy nose, sore throat, headache, decreased appetite, fatigue, and at least 15 episodes of vomiting today.  He denies abdominal pain, current nausea, or diarrhea.  No chest pain or shortness of breath or ear pain.  Has taken Tylenol for symptoms which helped minimally.  No known sick contacts.     Past Medical History:  Diagnosis Date   Asthma    no inhaler   Depression     Patient Active Problem List   Diagnosis Date Noted   Asthma, not well controlled 07/17/2020   Chronic rhinitis 07/17/2020    Past Surgical History:  Procedure Laterality Date   HEMORRHOID SURGERY N/A 03/20/2021   Procedure: HEMORRHOIDECTOMY PROLAPSED;  Surgeon: Fritzi Mandes, MD;  Location: [facility] OR;  Service: General;  Laterality: N/A;   NO PAST SURGERIES     RECTAL EXAM UNDER ANESTHESIA N/A 03/20/2021   Procedure: RECTAL EXAM UNDER ANESTHESIA;  Surgeon: Fritzi Mandes, MD;  Location: [facility] OR;  Service: General;  Laterality: N/A;       Home Medications    Prior to Admission medications   Medication Sig Start Date End Date Taking? Authorizing Provider  benzonatate (TESSALON) 100 MG capsule Take 1 capsule (100 mg total) by mouth 3 (three) times daily as needed for cough. Do not take with alcohol or while driving or operating heavy machinery.  May cause drowsiness. 08/18/23  Yes Valentino Nose, NP  oseltamivir (TAMIFLU) 75 MG capsule Take 1 capsule (75 mg total) by mouth every 12 (twelve) hours for 5 days. 08/18/23 08/23/23 Yes Cathlean Marseilles A, NP  ondansetron (ZOFRAN-ODT) 4 MG disintegrating tablet Take 1 tablet (4 mg total) by mouth every 8 (eight) hours as  needed for nausea or vomiting. 08/18/23   Valentino Nose, NP  promethazine-dextromethorphan (PROMETHAZINE-DM) 6.25-15 MG/5ML syrup Take 5 mLs by mouth at bedtime as needed for cough. 08/18/23   Valentino Nose, NP    Family History Family History  Problem Relation Age of Onset   Healthy Mother    Healthy Father    Heart disease Maternal Grandfather    Heart disease Maternal Uncle    Heart disease Maternal Uncle    Heart disease Cousin     Social History Social History   Tobacco Use   Smoking status: Never   Smokeless tobacco: Never  Vaping Use   Vaping status: Some Days   Last attempt to quit: 08/24/2020   Substances: Nicotine, CBD, Flavoring  Substance Use Topics   Alcohol use: Not Currently    Comment: pt st he drinks socially   Drug use: Never     Allergies   Patient has no known allergies.   Review of Systems Review of Systems Per HPI  Physical Exam Triage Vital Signs ED Triage Vitals  Encounter Vitals Group     BP 08/18/23 1545 (!) 153/86     Systolic BP Percentile --      Diastolic BP Percentile --      Pulse Rate 08/18/23 1545 87     Resp 08/18/23 1545 18     Temp 08/18/23  1545 99.1 F (37.3 C)     Temp Source 08/18/23 1545 Oral     SpO2 08/18/23 1545 93 %     Weight --      Height --      Head Circumference --      Peak Flow --      Pain Score 08/18/23 1544 10     Pain Loc --      Pain Education --      Exclude from Growth Chart --    No data found.  Updated Vital Signs BP (!) 153/86 (BP Location: Left Arm)   Pulse 87   Temp 99.1 F (37.3 C) (Oral)   Resp 18   SpO2 93%   Visual Acuity Right Eye Distance:   Left Eye Distance:   Bilateral Distance:    Right Eye Near:   Left Eye Near:    Bilateral Near:     Physical Exam Vitals and nursing note reviewed.  Constitutional:      General: He is not in acute distress.    Appearance: Normal appearance. He is ill-appearing. He is not toxic-appearing.  HENT:     Head:  Normocephalic and atraumatic.     Right Ear: Tympanic membrane, ear canal and external ear normal.     Left Ear: Tympanic membrane, ear canal and external ear normal.     Nose: Congestion present. No rhinorrhea.     Mouth/Throat:     Mouth: Mucous membranes are moist.     Pharynx: Oropharynx is clear. Posterior oropharyngeal erythema present. No oropharyngeal exudate.  Eyes:     General: No scleral icterus.    Extraocular Movements: Extraocular movements intact.  Cardiovascular:     Rate and Rhythm: Normal rate and regular rhythm.  Pulmonary:     Effort: Pulmonary effort is normal. No respiratory distress.     Breath sounds: Normal breath sounds. No wheezing, rhonchi or rales.  Musculoskeletal:     Cervical back: Normal range of motion and neck supple.  Lymphadenopathy:     Cervical: No cervical adenopathy.  Skin:    General: Skin is warm and dry.     Capillary Refill: Capillary refill takes less than 2 seconds.     Coloration: Skin is not jaundiced or pale.     Findings: No erythema or rash.  Neurological:     Mental Status: He is alert and oriented to person, place, and time.  Psychiatric:        Behavior: Behavior is cooperative.      UC Treatments / Results  Labs (all labs ordered are listed, but only abnormal results are displayed) Labs Reviewed  POC COVID19/FLU A&B COMBO - Abnormal; Notable for the following components:      Result Value   Influenza A Antigen, POC Positive (*)    All other components within normal limits    EKG   Radiology No results found.  Procedures Procedures (including critical care time)  Medications Ordered in UC Medications - No data to display  Initial Impression / Assessment and Plan / UC Course  I have reviewed the triage vital signs and the nursing notes.  Pertinent labs & imaging results that were available during my care of the patient were reviewed by me and considered in my medical decision making (see chart for  details).   Patient is well-appearing, afebrile, not tachycardic, not tachypneic, oxygenating well on room air.  Patient is hypertensive in urgent care today.  1. Influenza A Treat  with Tamiflu twice daily for 5 days Other supportive care discussed including pushing hydration with fluids, Zofran every 8 hours as needed for nausea/vomiting, Tylenol/ibuprofen Start cough suppressant medication ER and return precautions discussed with patient  The patient was given the opportunity to ask questions.  All questions answered to their satisfaction.  The patient is in agreement to this plan.    Final Clinical Impressions(s) / UC Diagnoses   Final diagnoses:  Influenza A     Discharge Instructions      You tested positive for influenza A.  Take the Tamiflu as prescribed to treat it.  Symptoms should improve over the next few days.  Continue Tylenol/ibuprofen as needed for fever or pain.  Take Zofran every 8 hours as needed for nausea/vomiting.  Some things that can make you feel better are: - Increased rest - Increasing fluid with water/sugar free electrolytes - Acetaminophen and ibuprofen as needed for fever/pain - Salt water gargling, chloraseptic spray and throat lozenges - OTC guaifenesin (Mucinex) 600 mg twice daily for congestion - Saline sinus flushes or a neti pot - Humidifying the air -Tessalon Perles every 8 hours as needed for dry cough and cough syrup at night time as needed     ED Prescriptions     Medication Sig Dispense Auth. Provider   promethazine-dextromethorphan (PROMETHAZINE-DM) 6.25-15 MG/5ML syrup Take 5 mLs by mouth at bedtime as needed for cough. 100 mL Cathlean Marseilles A, NP   ondansetron (ZOFRAN-ODT) 4 MG disintegrating tablet Take 1 tablet (4 mg total) by mouth every 8 (eight) hours as needed for nausea or vomiting. 20 tablet Cathlean Marseilles A, NP   benzonatate (TESSALON) 100 MG capsule Take 1 capsule (100 mg total) by mouth 3 (three) times daily as  needed for cough. Do not take with alcohol or while driving or operating heavy machinery.  May cause drowsiness. 30 capsule Cathlean Marseilles A, NP   oseltamivir (TAMIFLU) 75 MG capsule Take 1 capsule (75 mg total) by mouth every 12 (twelve) hours for 5 days. 10 capsule Valentino Nose, NP      PDMP not reviewed this encounter.   Valentino Nose, NP 08/18/23 [phone]

## 2023-08-18 NOTE — Discharge Instructions (Signed)
You tested positive for influenza A.  Take the Tamiflu as prescribed to treat it.  Symptoms should improve over the next few days.  Continue Tylenol/ibuprofen as needed for fever or pain.  Take Zofran every 8 hours as needed for nausea/vomiting.  Some things that can make you feel better are: - Increased rest - Increasing fluid with water/sugar free electrolytes - Acetaminophen and ibuprofen as needed for fever/pain - Salt water gargling, chloraseptic spray and throat lozenges - OTC guaifenesin (Mucinex) 600 mg twice daily for congestion - Saline sinus flushes or a neti pot - Humidifying the air -Tessalon Perles every 8 hours as needed for dry cough and cough syrup at night time as needed

## 2023-08-24 ENCOUNTER — Ambulatory Visit (HOSPITAL_COMMUNITY)
Admission: EM | Admit: 2023-08-24 | Discharge: 2023-08-24 | Disposition: A | Discharge status: Home / Self Care | Payer: Managed Care, Other (non HMO) | Attending: Internal Medicine | Admitting: Internal Medicine

## 2023-08-24 ENCOUNTER — Ambulatory Visit (INDEPENDENT_AMBULATORY_CARE_PROVIDER_SITE_OTHER): Payer: Managed Care, Other (non HMO)

## 2023-08-24 ENCOUNTER — Encounter (HOSPITAL_COMMUNITY): Payer: Self-pay | Admitting: Emergency Medicine

## 2023-08-24 DIAGNOSIS — J209 Acute bronchitis, unspecified: Secondary | ICD-10-CM | POA: Diagnosis present

## 2023-08-24 DIAGNOSIS — J1100 Influenza due to unidentified influenza virus with unspecified type of pneumonia: Secondary | ICD-10-CM

## 2023-08-24 LAB — CBC WITH DIFFERENTIAL/PLATELET
Abs Immature Granulocytes: 0 10*3/uL (ref 0.00–0.07)
Basophils Absolute: 0 10*3/uL (ref 0.0–0.1)
Basophils Relative: 0 %
Eosinophils Absolute: 0 10*3/uL (ref 0.0–0.5)
Eosinophils Relative: 0 %
HCT: 46.7 % (ref 39.0–52.0)
Hemoglobin: 16.3 g/dL (ref 13.0–17.0)
Lymphocytes Relative: 41 %
Lymphs Abs: 2.3 10*3/uL (ref 0.7–4.0)
MCH: 28.7 pg (ref 26.0–34.0)
MCHC: 34.9 g/dL (ref 30.0–36.0)
MCV: 82.2 fL (ref 80.0–100.0)
Monocytes Absolute: 0.3 10*3/uL (ref 0.1–1.0)
Monocytes Relative: 6 %
Neutro Abs: 3 10*3/uL (ref 1.7–7.7)
Neutrophils Relative %: 53 %
Platelets: 271 10*3/uL (ref 150–400)
RBC: 5.68 MIL/uL (ref 4.22–5.81)
RDW: 11.9 % (ref 11.5–15.5)
WBC: 5.6 10*3/uL (ref 4.0–10.5)
nRBC: 0 % (ref 0.0–0.2)
nRBC: 0 /100{WBCs}

## 2023-08-24 LAB — BASIC METABOLIC PANEL
Anion gap: 13 (ref 5–15)
BUN: 15 mg/dL (ref 6–20)
CO2: 23 mmol/L (ref 22–32)
Calcium: 9.7 mg/dL (ref 8.9–10.3)
Chloride: 102 mmol/L (ref 98–111)
Creatinine, Ser: 0.86 mg/dL (ref 0.61–1.24)
GFR, Estimated: >60 mL/min (ref >60)
Glucose, Bld: 64 mg/dL — ABNORMAL LOW (ref 70–99)
Potassium: 3.8 mmol/L (ref 3.5–5.1)
Sodium: 138 mmol/L (ref 135–145)

## 2023-08-24 MED ORDER — PREDNISONE 20 MG PO TABS
40.0000 mg | ORAL_TABLET | Freq: Every day | ORAL | 0 refills | Status: AC
Start: 2023-08-24 — End: 2023-08-29

## 2023-08-24 MED ORDER — ALBUTEROL SULFATE HFA 108 (90 BASE) MCG/ACT IN AERS: 1.0000 | INHALATION_SPRAY | Freq: Four times a day (QID) | RESPIRATORY_TRACT | 0 refills | Status: AC | PRN

## 2023-08-24 NOTE — Discharge Instructions (Signed)
Please maintain adequate hydration Your chest x-ray is negative for pneumonia I will give you an inhaler to use every 4-6 hours as needed for wheezing or persistent cough You have also been prescribed prednisone for 5 days Continue taking cough medications Will call you with recommendations if labs are abnormal Please return to urgent care if symptoms worsen.

## 2023-08-24 NOTE — ED Triage Notes (Signed)
Patient c/o diarrhea, vomiting, cough, headache x 1 week.  Patient is feeling weak.  Patient has taken Zofran, OTC pain meds, Promethazine cough syrup and Benadryl.

## 2023-08-24 NOTE — ED Provider Notes (Signed)
[facility]    CSN: [id number] Arrival date & time: 08/24/23  1039      History   Chief Complaint Chief Complaint  Patient presents with   Cough    HPI Jeremy Copeland is a 22 y.o. male recently diagnosed with influenza A comes to the urgent care with persistent vomiting, headache and a cough.  Patient is also complaining of generalized weakness.  He is oral intake is decreased over the course of the past week.  He is taking promethazine-DM cough syrup and Zofran with no improvement in his symptoms.  He denies any fever or chills.  He has some shortness of breath and wheezing.  No chest pain or chest pressure.  No muscle cramps.   HPI  Past Medical History:  Diagnosis Date   Asthma    no inhaler   Depression     Patient Active Problem List   Diagnosis Date Noted   Asthma, not well controlled 07/17/2020   Chronic rhinitis 07/17/2020    Past Surgical History:  Procedure Laterality Date   HEMORRHOID SURGERY N/A 03/20/2021   Procedure: HEMORRHOIDECTOMY PROLAPSED;  Surgeon: Fritzi Mandes, MD;  Location: [facility] OR;  Service: General;  Laterality: N/A;   NO PAST SURGERIES     RECTAL EXAM UNDER ANESTHESIA N/A 03/20/2021   Procedure: RECTAL EXAM UNDER ANESTHESIA;  Surgeon: Fritzi Mandes, MD;  Location: [facility] OR;  Service: General;  Laterality: N/A;       Home Medications    Prior to Admission medications   Medication Sig Start Date End Date Taking? Authorizing Provider  albuterol (VENTOLIN HFA) 108 (90 Base) MCG/ACT inhaler Inhale 1-2 puffs into the lungs every 6 (six) hours as needed for wheezing or shortness of breath. 08/24/23  Yes Mariyam Remington, Britta Mccreedy, MD  ondansetron (ZOFRAN-ODT) 4 MG disintegrating tablet Take 1 tablet (4 mg total) by mouth every 8 (eight) hours as needed for nausea or vomiting. 08/18/23  Yes Valentino Nose, NP  predniSONE (DELTASONE) 20 MG tablet Take 2 tablets (40 mg total) by mouth daily for 5 days. 08/24/23 08/29/23 Yes Othon Guardia, Britta Mccreedy, MD   promethazine-dextromethorphan (PROMETHAZINE-DM) 6.25-15 MG/5ML syrup Take 5 mLs by mouth at bedtime as needed for cough. 08/18/23  Yes Valentino Nose, NP  benzonatate (TESSALON) 100 MG capsule Take 1 capsule (100 mg total) by mouth 3 (three) times daily as needed for cough. Do not take with alcohol or while driving or operating heavy machinery.  May cause drowsiness. 08/18/23   Valentino Nose, NP    Family History Family History  Problem Relation Age of Onset   Healthy Mother    Healthy Father    Heart disease Maternal Grandfather    Heart disease Maternal Uncle    Heart disease Maternal Uncle    Heart disease Cousin     Social History Social History   Tobacco Use   Smoking status: Never   Smokeless tobacco: Never  Vaping Use   Vaping status: Some Days   Last attempt to quit: 08/24/2020   Substances: Nicotine, CBD, Flavoring  Substance Use Topics   Alcohol use: Not Currently    Comment: pt st he drinks socially   Drug use: Never     Allergies   Patient has no known allergies.   Review of Systems Review of Systems As per HPI  Physical Exam Triage Vital Signs ED Triage Vitals  Encounter Vitals Group     BP 08/24/23 1136 (!) 140/56  Systolic BP Percentile --      Diastolic BP Percentile --      Pulse Rate 08/24/23 1136 90     Resp 08/24/23 1136 18     Temp 08/24/23 1136 98.7 F (37.1 C)     Temp Source 08/24/23 1136 Oral     SpO2 08/24/23 1136 94 %     Weight 08/24/23 1137 215 lb (97.5 kg)     Height 08/24/23 1137 5\' 11"  (1.803 m)     Head Circumference --      Peak Flow --      Pain Score 08/24/23 1137 10     Pain Loc --      Pain Education --      Exclude from Growth Chart --    No data found.  Updated Vital Signs BP (!) 140/56 (BP Location: Left Arm)   Pulse 90   Temp 98.7 F (37.1 C) (Oral)   Resp 18   Ht 5\' 11"  (1.803 m)   Wt 97.5 kg   SpO2 94%   BMI 29.99 kg/m   Visual Acuity Right Eye Distance:   Left Eye Distance:    Bilateral Distance:    Right Eye Near:   Left Eye Near:    Bilateral Near:     Physical Exam Vitals and nursing note reviewed.  Constitutional:      General: He is not in acute distress.    Appearance: Normal appearance. He is not ill-appearing.  HENT:     Mouth/Throat:     Mouth: Mucous membranes are moist.     Pharynx: No oropharyngeal exudate or posterior oropharyngeal erythema.  Cardiovascular:     Rate and Rhythm: Normal rate and regular rhythm.     Pulses: Normal pulses.     Heart sounds: Normal heart sounds.  Pulmonary:     Effort: Pulmonary effort is normal.     Breath sounds: Wheezing present. No rhonchi.  Abdominal:     General: Bowel sounds are normal.     Palpations: Abdomen is soft.  Neurological:     Mental Status: He is alert.      UC Treatments / Results  Labs (all labs ordered are listed, but only abnormal results are displayed) Labs Reviewed  BASIC METABOLIC PANEL - Abnormal; Notable for the following components:      Result Value   Glucose, Bld 64 (*)    All other components within normal limits  CBC WITH DIFFERENTIAL/PLATELET    EKG   Radiology DG Chest 2 View Result Date: 08/24/2023 CLINICAL DATA:  Productive cough and fever for 1 week. EXAM: CHEST - 2 VIEW COMPARISON:  July 05, 2021. FINDINGS: The heart size and mediastinal contours are within normal limits. Both lungs are clear. The visualized skeletal structures are unremarkable. IMPRESSION: No active cardiopulmonary disease. Electronically Signed   By: Lupita Raider M.D.   On: 08/24/2023 13:53    Procedures Procedures (including critical care time)  Medications Ordered in UC Medications - No data to display  Initial Impression / Assessment and Plan / UC Course  I have reviewed the triage vital signs and the nursing notes.  Pertinent labs & imaging results that were available during my care of the patient were reviewed by me and considered in my medical decision making (see  chart for details).     1.  Acute viral bronchitis with bronchospasm: Chest x-ray is negative for acute lung infiltrates-no follow-up call to the patient is needed Albuterol inhaler  as needed for chest tightness or wheezing Prednisone 40 mg orally daily for 5 days CBC, BMP-no follow-up call to the patient is needed Return precautions given. Final Clinical Impressions(s) / UC Diagnoses   Final diagnoses:  Acute bronchitis with bronchospasm     Discharge Instructions      Please maintain adequate hydration Your chest x-ray is negative for pneumonia I will give you an inhaler to use every 4-6 hours as needed for wheezing or persistent cough You have also been prescribed prednisone for 5 days Continue taking cough medications Will call you with recommendations if labs are abnormal Please return to urgent care if symptoms worsen.   ED Prescriptions     Medication Sig Dispense Auth. Provider   albuterol (VENTOLIN HFA) 108 (90 Base) MCG/ACT inhaler Inhale 1-2 puffs into the lungs every 6 (six) hours as needed for wheezing or shortness of breath. 6.7 g Lundon Verdejo, Britta Mccreedy, MD   predniSONE (DELTASONE) 20 MG tablet Take 2 tablets (40 mg total) by mouth daily for 5 days. 10 tablet Jourdon Zimmerle, Britta Mccreedy, MD      PDMP not reviewed this encounter.   Merrilee Jansky, MD 08/24/23 [phone]
